# Patient Record
Sex: Female | Born: 1990
Health system: Southern US, Community
[De-identification: ages and names within clinical notes are randomized; demographics above are authoritative.]

## PROBLEM LIST (undated history)

## (undated) DIAGNOSIS — Z8659 Personal history of other mental and behavioral disorders: Secondary | ICD-10-CM

## (undated) DIAGNOSIS — R000 Tachycardia, unspecified: Secondary | ICD-10-CM

## (undated) DIAGNOSIS — F988 Other specified behavioral and emotional disorders with onset usually occurring in childhood and adolescence: Secondary | ICD-10-CM

## (undated) HISTORY — DX: Tachycardia, unspecified: R00.0

## (undated) HISTORY — DX: Personal history of other mental and behavioral disorders: Z86.59

## (undated) HISTORY — DX: Other specified behavioral and emotional disorders with onset usually occurring in childhood and adolescence: F98.8

---

## 2016-06-02 ENCOUNTER — Ambulatory Visit (INDEPENDENT_AMBULATORY_CARE_PROVIDER_SITE_OTHER): Payer: 59 | Admitting: Osteopathic Medicine

## 2016-06-02 VITALS — BP 130/88 | HR 97 | Wt 158.0 lb

## 2016-06-02 DIAGNOSIS — Z3041 Encounter for surveillance of contraceptive pills: Secondary | ICD-10-CM | POA: Diagnosis not present

## 2016-06-02 DIAGNOSIS — Z8659 Personal history of other mental and behavioral disorders: Secondary | ICD-10-CM | POA: Diagnosis not present

## 2016-06-02 DIAGNOSIS — Z113 Encounter for screening for infections with a predominantly sexual mode of transmission: Secondary | ICD-10-CM

## 2016-06-02 DIAGNOSIS — F988 Other specified behavioral and emotional disorders with onset usually occurring in childhood and adolescence: Secondary | ICD-10-CM | POA: Insufficient documentation

## 2016-06-02 DIAGNOSIS — N63 Unspecified lump in unspecified breast: Secondary | ICD-10-CM | POA: Insufficient documentation

## 2016-06-02 HISTORY — DX: Other specified behavioral and emotional disorders with onset usually occurring in childhood and adolescence: F98.8

## 2016-06-02 MED ORDER — LAMOTRIGINE 25 MG PO TABS: 25.0000 mg | ORAL_TABLET | Freq: Every day | ORAL | 3 refills | Status: DC

## 2016-06-02 MED ORDER — NORGESTIMATE-ETH ESTRADIOL 0.25-35 MG-MCG PO TABS: 1.0000 | ORAL_TABLET | Freq: Every day | ORAL | 4 refills | Status: DC

## 2016-06-02 NOTE — Progress Notes (Signed)
HPI: Jenny Navarro is a 25 y.o. female  who presents to [facility] [place] today, 06/02/16,  for chief complaint of:  Chief Complaint  Patient presents with  . Establish Care    R breast lump bothering her  No medications in the past month - list includes Sprintec, Lamictal 25 mg bid, Vyvanse 75 mg daily + Adderall 10 mg daily  ADHD: Going to school for nursing. Previously on Strattera,. Had been on Vyvanse x2 years and Adderall added a bit after then. Awaiting records.   Previous treatment with Cymbalta caused panic attacks, Celexa, Risperdal, another mood stabilizer can't remember. Lamictal had been helping.     Past medical, surgical, social and family history reviewed: No past medical history on file. No past surgical history on file. Social History  Substance Use Topics  . Smoking status: Not on file  . Smokeless tobacco: Not on file  . Alcohol use Not on file   No family history on file.   Current medication list and allergy/intolerance information reviewed:   No current outpatient prescriptions on file.   No current facility-administered medications for this visit.    Allergies  Allergen Reactions  . Penicillins       Review of Systems:  Constitutional:  No  fever, no chills, No recent illness, No unintentional weight changes. No significant fatigue.   HEENT: No  headache, no vision change, no hearing change, No sore throat, No  sinus pressure  Cardiac: No  chest pain, No  pressure, No palpitations, No  Orthopnea  Respiratory:  No  shortness of breath. No  Cough  Gastrointestinal: No  abdominal pain, No  nausea, No  vomiting,  No  blood in stool, No  diarrhea, No  constipation   Musculoskeletal: No new myalgia/arthralgia  Genitourinary: No  incontinence, No  abnormal genital bleeding, No abnormal genital discharge  Skin: No  Rash, No other wounds/concerning lesions  Hem/Onc: No  easy bruising/bleeding, No  abnormal lymph  node  Endocrine: No cold intolerance,  No heat intolerance. No polyuria/polydipsia/polyphagia   Neurologic: No  weakness, No  dizziness, No  slurred speech/focal weakness/facial droop  Psychiatric: No  concerns with depression, No  concerns with anxiety, No sleep problems, No mood problems  Exam:  BP 130/88   Pulse 97   Wt 158 lb (71.7 kg)   Constitutional: VS see above. General Appearance: alert, well-developed, well-nourished, NAD  Eyes: Normal lids and conjunctive, non-icteric sclera  Ears, Nose, Mouth, Throat: MMM, Normal external inspection ears/nares/mouth/lips/gums.   Neck: No masses, trachea midline. No thyroid enlargement. No tenderness/mass appreciated. No lymphadenopathy  Respiratory: Normal respiratory effort. no wheeze, no rhonchi, no rales  Cardiovascular: S1/S2 normal, no murmur, no rub/gallop auscultated. RRR.   Musculoskeletal: Gait normal. No clubbing/cyanosis of digits.   Neurological:  Normal balance/coordination. No tremor.   Skin: warm, dry, intact. No rash/ulcer. No concerning nevi or subq nodules on limited exam.    Psychiatric: Normal judgment/insight. Normal mood and affect. Oriented x3.   BREAST: No rashes/skin changes, normal fibrous breast tissue though a bit more dense at next position just adjacent to right nipple, no masses or tenderness, normal nipple without discharge, normal axilla     ASSESSMENT/PLAN:   History of bipolar disorder - Patient reports has been well controlled on Lamictal, refilled this medication 06/02/2016 - Plan: lamoTRIgine (LAMICTAL) 25 MG tablet, COMPLETE METABOLIC PANEL WITH GFR, CBC with Differential/Platelet, TSH, Pregnancy, urine  Oral contraceptive pill surveillance - Plan: norgestimate-ethinyl estradiol (ORTHO-CYCLEN,SPRINTEC,PREVIFEM)  0.25-35 MG-MCG tablet, COMPLETE METABOLIC PANEL WITH GFR, CBC with Differential/Platelet, Pregnancy, urine  Routine screening for STI (sexually transmitted infection) - Plan: HIV  antibody, RPR, Hepatitis B surface antigen, Hepatitis B core antibody, total, GC/chlamydia probe amp, urine  Adult attention deficit disorder - Awaiting records as of 06/02/2016 - Plan: Drug Screen, Urine  Breast lump in female - Offered ultrasound though I suspect she has prominent lobar fibrocystic change, I don't feel any cyst or mass   Patient Instructions  Will review records from previous behavioral health with regard to care for bipolar disorder and attention deficit disorder. If no concerns on records or the urine drug screen, I'm okay to refill the Vyvanse plus the Adderall with plans to follow-up in 3 months in the office with me.  Prescriptions have been sent for birth control and Lamictal, let me know if any issues getting these medications. Please wait till you hear from [place] for confirmed negative pregnancy before starting these medications. Birth control pills can be started within 5 days of the first day of your period without needing to use a backup method. Ask your pharmacist if there are any other questions about restarting this medicine.   If breast lump changes or worsens in any way, please let me know! We have the option for further evaluation with an ultrasound if there is any concern.    Visit summary with medication list and pertinent instructions was printed for patient to review. All questions at time of visit were answered - patient instructed to contact office with any additional concerns. ER/RTC precautions were reviewed with the patient. Follow-up plan: Return in about 3 months (around 09/02/2016) for MEDICATION MANAGEMENT - ADD AND MOOD, sooner if needed.

## 2016-06-02 NOTE — Patient Instructions (Signed)
Will review records from previous behavioral health with regard to care for bipolar disorder and attention deficit disorder. If no concerns, but on the records or the urine drug screen, I'm okay to refill the Vyvanse plus the Adderall with plans to follow-up in 3 months in the office with me.  Prescriptions have been sent for birth control and Lamictal, let me know if any issues getting these medications. Please wait till use here from us for confirmed negative pregnancy before starting these medications. Birth control pills can be started within 5 days of the first day of your period without needing to use a backup method. Ask your pharmacist if there are any other questions about restarting this medicine.   If breast lump changes or worsens in any way, please let me know! We have the option for further evaluation with an ultrasound if there is any concern.

## 2016-06-03 LAB — CBC WITH DIFFERENTIAL/PLATELET
BASOS PCT: 1 %
Basophils Absolute: 69 cells/uL (ref 0–200)
EOS ABS: 345 {cells}/uL (ref 15–500)
Eosinophils Relative: 5 %
HEMATOCRIT: 41.7 % (ref 35.0–45.0)
HEMOGLOBIN: 13.7 g/dL (ref 11.7–15.5)
LYMPHS ABS: 2898 {cells}/uL (ref 850–3900)
Lymphocytes Relative: 42 %
MCH: 30.9 pg (ref 27.0–33.0)
MCHC: 32.9 g/dL (ref 32.0–36.0)
MCV: 94.1 fL (ref 80.0–100.0)
MONO ABS: 690 {cells}/uL (ref 200–950)
MPV: 10.2 fL (ref 7.5–12.5)
Monocytes Relative: 10 %
NEUTROS PCT: 42 %
Neutro Abs: 2898 cells/uL (ref 1500–7800)
Platelets: 458 10*3/uL — ABNORMAL HIGH (ref 140–400)
RBC: 4.43 MIL/uL (ref 3.80–5.10)
RDW: 12.6 % (ref 11.0–15.0)
WBC: 6.9 10*3/uL (ref 3.8–10.8)

## 2016-06-03 LAB — HIV ANTIBODY (ROUTINE TESTING W REFLEX): HIV: NONREACTIVE

## 2016-06-03 LAB — COMPLETE METABOLIC PANEL WITH GFR
ALT: 21 U/L (ref 6–29)
AST: 25 U/L (ref 10–30)
Albumin: 4.1 g/dL (ref 3.6–5.1)
Alkaline Phosphatase: 74 U/L (ref 33–115)
BUN: 6 mg/dL — AB (ref 7–25)
CALCIUM: 9.7 mg/dL (ref 8.6–10.2)
CHLORIDE: 104 mmol/L (ref 98–110)
CO2: 24 mmol/L (ref 20–31)
Creat: 0.66 mg/dL (ref 0.50–1.10)
GFR, Est African American: >89 mL/min (ref >=60)
GFR, Est Non African American: >89 mL/min (ref >=60)
Glucose, Bld: 114 mg/dL — ABNORMAL HIGH (ref 65–99)
POTASSIUM: 4.2 mmol/L (ref 3.5–5.3)
SODIUM: 137 mmol/L (ref 135–146)
Total Bilirubin: 0.2 mg/dL (ref 0.2–1.2)
Total Protein: 7 g/dL (ref 6.1–8.1)

## 2016-06-03 LAB — GC/CHLAMYDIA PROBE AMP
CT PROBE, AMP APTIMA: NOT DETECTED
GC PROBE AMP APTIMA: NOT DETECTED

## 2016-06-03 LAB — PREGNANCY, URINE: Preg Test, Ur: NEGATIVE

## 2016-06-03 LAB — HEPATITIS B SURFACE ANTIGEN: Hepatitis B Surface Ag: NEGATIVE

## 2016-06-03 LAB — TSH: TSH: 1.33 mIU/L

## 2016-06-03 LAB — RPR

## 2016-06-03 LAB — HEPATITIS B CORE ANTIBODY, TOTAL: HEP B C TOTAL AB: NONREACTIVE

## 2016-06-04 LAB — PAIN MGMT, PROFILE 6 CONF W/O MM, U
6 Acetylmorphine: NEGATIVE ng/mL (ref <10)
Alcohol Metabolites: NEGATIVE ng/mL (ref <500)
Amphetamines: NEGATIVE ng/mL (ref <500)
Barbiturates: NEGATIVE ng/mL (ref <300)
Benzodiazepines: NEGATIVE ng/mL (ref <100)
Cocaine Metabolite: NEGATIVE ng/mL (ref <150)
Creatinine: 148.2 mg/dL (ref >=20.0)
Marijuana Metabolite: NEGATIVE ng/mL (ref <20)
Methadone Metabolite: NEGATIVE ng/mL (ref <100)
OPIATES: NEGATIVE ng/mL (ref <100)
Oxidant: NEGATIVE ug/mL (ref <200)
Oxycodone: NEGATIVE ng/mL (ref <100)
PLEASE NOTE: 0
Phencyclidine: NEGATIVE ng/mL (ref <25)
pH: 6.81 (ref 4.5–9.0)

## 2016-06-11 ENCOUNTER — Telehealth: Payer: Self-pay | Admitting: Osteopathic Medicine

## 2016-06-11 DIAGNOSIS — F988 Other specified behavioral and emotional disorders with onset usually occurring in childhood and adolescence: Secondary | ICD-10-CM

## 2016-06-11 MED ORDER — AMPHETAMINE-DEXTROAMPHETAMINE 10 MG PO TABS: 10.0000 mg | ORAL_TABLET | Freq: Every day | ORAL | 0 refills | Status: DC

## 2016-06-11 MED ORDER — LISDEXAMFETAMINE DIMESYLATE 70 MG PO CAPS: 70.0000 mg | ORAL_CAPSULE | ORAL | 0 refills | Status: DC

## 2016-06-11 NOTE — Telephone Encounter (Signed)
Please call patient: I have received previous records for bipolar and ADHD. OK to right for the Vyvanse 70 mg and Adderall 10 mg, Rx printed to pick up at front

## 2016-06-12 MED FILL — DEXTROAMP-AMP 10 MG TAB: 10 | 30 days supply | Qty: 30 | Fill #0

## 2016-06-12 MED FILL — MONO-LINYAH 28 TABLET: 0.25-35 | 84 days supply | Qty: 84 | Fill #0

## 2016-06-12 MED FILL — VYVANSE 70 MG CAPSULE: 70 | 30 days supply | Qty: 30 | Fill #0

## 2016-06-12 MED FILL — lamoTRIgine 25 MG TABS: 25 | 60 days supply | Qty: 60 | Fill #0

## 2016-06-12 NOTE — Telephone Encounter (Signed)
Left message on patient vm advising that Rx for Adderall and Vyvanse is ready for pickup. Jeevan Kalla,CMA

## 2016-07-13 MED FILL — DEXTROAMP-AMP 10 MG TAB: 10 | 30 days supply | Qty: 30 | Fill #0

## 2016-07-13 MED FILL — VYVANSE 70 MG CAPSULE: 70 | 30 days supply | Qty: 30 | Fill #0

## 2016-08-12 MED FILL — lamoTRIgine 25 MG TABS: 25 | 60 days supply | Qty: 60 | Fill #1

## 2016-08-12 MED FILL — DEXTROAMP-AMP 10 MG TAB: 10 | 30 days supply | Qty: 30 | Fill #0

## 2016-08-12 MED FILL — VYVANSE 70 MG CAPSULE: 70 | 30 days supply | Qty: 30 | Fill #0

## 2016-09-02 ENCOUNTER — Ambulatory Visit: Payer: 59 | Admitting: Osteopathic Medicine

## 2016-09-07 ENCOUNTER — Encounter: Payer: Self-pay | Admitting: Osteopathic Medicine

## 2016-09-07 ENCOUNTER — Ambulatory Visit (INDEPENDENT_AMBULATORY_CARE_PROVIDER_SITE_OTHER): Payer: 59 | Admitting: Osteopathic Medicine

## 2016-09-07 VITALS — BP 138/92 | HR 86 | Ht 66.0 in | Wt 150.0 lb

## 2016-09-07 DIAGNOSIS — N926 Irregular menstruation, unspecified: Secondary | ICD-10-CM | POA: Diagnosis not present

## 2016-09-07 DIAGNOSIS — F988 Other specified behavioral and emotional disorders with onset usually occurring in childhood and adolescence: Secondary | ICD-10-CM | POA: Diagnosis not present

## 2016-09-07 DIAGNOSIS — Z30011 Encounter for initial prescription of contraceptive pills: Secondary | ICD-10-CM

## 2016-09-07 DIAGNOSIS — Z3009 Encounter for other general counseling and advice on contraception: Secondary | ICD-10-CM

## 2016-09-07 DIAGNOSIS — Z8659 Personal history of other mental and behavioral disorders: Secondary | ICD-10-CM | POA: Diagnosis not present

## 2016-09-07 LAB — POCT URINE PREGNANCY: PREG TEST UR: NEGATIVE

## 2016-09-07 MED ORDER — AMPHETAMINE-DEXTROAMPHETAMINE 10 MG PO TABS: 10.0000 mg | ORAL_TABLET | Freq: Every day | ORAL | 0 refills | Status: DC

## 2016-09-07 MED ORDER — MEDROXYPROGESTERONE ACETATE 150 MG/ML IM SUSP
150.0000 mg | Freq: Once | INTRAMUSCULAR | Status: AC
  Administered 2016-09-07: 150 mg via INTRAMUSCULAR

## 2016-09-07 MED ORDER — LISDEXAMFETAMINE DIMESYLATE 70 MG PO CAPS: 70.0000 mg | ORAL_CAPSULE | ORAL | 0 refills | Status: DC

## 2016-09-07 MED ORDER — LAMOTRIGINE 25 MG PO TABS: 25.0000 mg | ORAL_TABLET | Freq: Two times a day (BID) | ORAL | 5 refills | Status: DC

## 2016-09-07 NOTE — Progress Notes (Signed)
HPI: Jenny Navarro is a 26 y.o. female  who presents to [facility] [place] today, 09/07/16,  for chief complaint of:  Chief Complaint  Patient presents with  . Follow-up    ADD    Doing well on current ADHD medications, would like to continue  Bipolar: incorrect sig for Lamictal on file, this was corrected and prescription renewed. She notes that depression has been more of a problem since her dose decreased  Contraception: asks about alternative to her current birth control, is having some irregular bleeding and would like to get on the Depo shot.    Past medical, surgical, social and family history reviewed: Patient Active Problem List   Diagnosis Date Noted  . Breast lump in female 06/02/2016  . Adult attention deficit disorder 06/02/2016   No past surgical history on file. Social History  Substance Use Topics  . Smoking status: Never Smoker  . Smokeless tobacco: Never Used  . Alcohol use Not on file   No family history on file.   Current medication list and allergy/intolerance information reviewed:   Current Outpatient Prescriptions on File Prior to Visit  Medication Sig Dispense Refill  . amphetamine-dextroamphetamine (ADDERALL) 10 MG tablet Take 1 tablet (10 mg total) by mouth daily after lunch. 30 tablet 0  . lamoTRIgine (LAMICTAL) 25 MG tablet Take 1 tablet (25 mg total) by mouth daily. 60 tablet 3  . lisdexamfetamine (VYVANSE) 70 MG capsule Take 1 capsule (70 mg total) by mouth every morning. 30 capsule 0  . norgestimate-ethinyl estradiol (ORTHO-CYCLEN,SPRINTEC,PREVIFEM) 0.25-35 MG-MCG tablet Take 1 tablet by mouth daily. Use as directed. 3 Package 4   No current facility-administered medications on file prior to visit.    Allergies  Allergen Reactions  . Penicillins       Review of Systems:  Constitutional: feels well today  Cardiac: No  chest pain  Respiratory:  No  shortness of breath  Gastrointestinal: No  abdominal  pain  GU: irregular periods on birth control, cramping, no abnormal vaginal dscharge  Musculoskeletal: No new myalgia/arthralgia  Neurologic: No  weakness, No  Dizziness  Psychiatric: +concerns with depression, +concerns with anxiety  Exam:  BP (!) 138/92   Pulse 86   Ht 5\' 6"  (1.676 m)   Wt 150 lb (68 kg)   BMI 24.21 kg/m   Constitutional: VS see above. General Appearance: alert, well-developed, well-nourished, NAD  Eyes: Normal lids and conjunctive, non-icteric sclera  Ears, Nose, Mouth, Throat: MMM, Normal external inspection ears/nares/mouth/lips/gums.  Neck: No masses, trachea midline.   Respiratory: Normal respiratory effort. no wheeze, no rhonchi, no rales  Cardiovascular: S1/S2 normal, no murmur, no rub/gallop auscultated. RRR.   Musculoskeletal: Gait normal. Symmetric and independent movement of all extremities  Neurological: Normal balance/coordination. No tremor.  Skin: warm, dry, intact.   Psychiatric: Normal judgment/insight. Normal mood and affect. Oriented x3.    Results for orders placed or performed in visit on 09/07/16 (from the past 72 hour(s))  POCT urine pregnancy     Status: None   Collection Time: 09/07/16  3:18 PM  Result Value Ref Range   Preg Test, Ur Negative Negative      ASSESSMENT/PLAN:   Attention deficit disorder, unspecified hyperactivity presence - 3 mos medications Rx'ed, ok to refill x3 mos at nurse visit for Depo shot w/ f/u Dr. Mervyn Skeeters. in 6 mos - Plan: amphetamine-dextroamphetamine (ADDERALL) 10 MG tablet, lisdexamfetamine (VYVANSE) 70 MG capsule, DISCONTINUED: lisdexamfetamine (VYVANSE) 70 MG capsule, DISCONTINUED: amphetamine-dextroamphetamine (ADDERALL) 10 MG  tablet, DISCONTINUED: amphetamine-dextroamphetamine (ADDERALL) 10 MG tablet, DISCONTINUED: lisdexamfetamine (VYVANSE) 70 MG capsule  History of bipolar disorder - Plan: lamoTRIgine (LAMICTAL) 25 MG tablet  Menstrual periods irregular - likely OCP, pt opts for  Depo  Encounter for other general counseling or advice on contraception - Declines alternative OCP/ring, IUD, Nexplanon - Plan: POCT urine pregnancy, medroxyPROGESTERone (DEPO-PROVERA) injection 150 mg    Patient Instructions  Follow up in 12 weeks for next Depo shot - call ahead to be placed on the nurse visit schedule and also ask to pick up refills of your Attention Deficit medications - be sure to ask about these ahead of time so prescriptions will be printed and ready for you. Plan to follow-up for visit with Dr Lyn HollingsheadAlexander in 6 months around time due for third Depo shot. Any questions or concerns  In the meantime, let us know!     Visit summary with medication list and pertinent instructions was printed for patient to review. All questions at time of visit were answered - patient instructed to contact office with any additional concerns. ER/RTC precautions were reviewed with the patient. Follow-up plan: Return in about 12 weeks (around 11/30/2016) for depo shot (can pick up another 3 months ADHD medications at that nurse visit - call ahead of appt.

## 2016-09-07 NOTE — Patient Instructions (Signed)
Follow up in 12 weeks for next Depo shot - call ahead to be placed on the nurse visit schedule and also ask to pick up refills of your Attention Deficit medications - be sure to ask about these ahead of time so prescriptions will be printed and ready for you. Plan to follow-up for visit with Dr Lyn HollingsheadAlexander in 6 months around time due for third Depo shot. Any questions or concerns  In the meantime, let us know!

## 2016-09-08 DIAGNOSIS — Z8659 Personal history of other mental and behavioral disorders: Secondary | ICD-10-CM | POA: Insufficient documentation

## 2016-09-08 HISTORY — DX: Personal history of other mental and behavioral disorders: Z86.59

## 2016-09-08 MED FILL — DEXTROAMP-AMP 10 MG TAB: 10 | 30 days supply | Qty: 30 | Fill #0

## 2016-09-08 MED FILL — VYVANSE 70 MG CAPSULE: 70 | 30 days supply | Qty: 30 | Fill #0

## 2016-10-09 MED FILL — lamoTRIgine 25 MG TABS: 25 | 30 days supply | Qty: 60 | Fill #0 | Status: TO

## 2016-10-09 MED FILL — VYVANSE 70 MG CAPSULE: 70 | 30 days supply | Qty: 30 | Fill #0

## 2016-10-09 MED FILL — DEXTROAMP-AMPHETAMIN 10 MG: 10 | 30 days supply | Qty: 30 | Fill #0

## 2016-11-06 MED FILL — DEXTROAMP-AMPHETAMIN 10 MG: 10 | 30 days supply | Qty: 30 | Fill #0

## 2016-11-06 MED FILL — VYVANSE 70 MG CAPSULE: 70 | 30 days supply | Qty: 30 | Fill #0

## 2016-11-17 ENCOUNTER — Other Ambulatory Visit: Payer: Self-pay | Admitting: Osteopathic Medicine

## 2016-11-17 DIAGNOSIS — F988 Other specified behavioral and emotional disorders with onset usually occurring in childhood and adolescence: Secondary | ICD-10-CM

## 2016-11-23 MED FILL — lamoTRIgine 25 MG TABS: 25 | 30 days supply | Qty: 60 | Fill #0

## 2016-11-30 ENCOUNTER — Ambulatory Visit: Payer: 59

## 2016-12-01 ENCOUNTER — Ambulatory Visit: Payer: 59

## 2016-12-02 ENCOUNTER — Encounter: Payer: Self-pay | Admitting: Osteopathic Medicine

## 2016-12-02 ENCOUNTER — Ambulatory Visit (INDEPENDENT_AMBULATORY_CARE_PROVIDER_SITE_OTHER): Payer: 59 | Admitting: Osteopathic Medicine

## 2016-12-02 VITALS — BP 141/93 | HR 94 | Ht 66.0 in | Wt 147.0 lb

## 2016-12-02 DIAGNOSIS — Z308 Encounter for other contraceptive management: Secondary | ICD-10-CM | POA: Diagnosis not present

## 2016-12-02 DIAGNOSIS — F339 Major depressive disorder, recurrent, unspecified: Secondary | ICD-10-CM | POA: Diagnosis not present

## 2016-12-02 DIAGNOSIS — R030 Elevated blood-pressure reading, without diagnosis of hypertension: Secondary | ICD-10-CM

## 2016-12-02 DIAGNOSIS — F988 Other specified behavioral and emotional disorders with onset usually occurring in childhood and adolescence: Secondary | ICD-10-CM

## 2016-12-02 DIAGNOSIS — Z8659 Personal history of other mental and behavioral disorders: Secondary | ICD-10-CM | POA: Diagnosis not present

## 2016-12-02 MED ORDER — BUPROPION HCL ER (XL) 150 MG PO TB24: 150.0000 mg | ORAL_TABLET | Freq: Every day | ORAL | 1 refills | Status: DC

## 2016-12-02 MED ORDER — LISDEXAMFETAMINE DIMESYLATE 70 MG PO CAPS: 70.0000 mg | ORAL_CAPSULE | ORAL | 0 refills | Status: DC

## 2016-12-02 MED ORDER — LISDEXAMFETAMINE DIMESYLATE 60 MG PO CAPS: 60.0000 mg | ORAL_CAPSULE | ORAL | 0 refills | Status: DC

## 2016-12-02 MED ORDER — MEDROXYPROGESTERONE ACETATE 150 MG/ML IM SUSP
150.0000 mg | Freq: Once | INTRAMUSCULAR | Status: AC
  Administered 2016-12-02: 150 mg via INTRAMUSCULAR

## 2016-12-02 MED ORDER — AMPHETAMINE-DEXTROAMPHETAMINE 10 MG PO TABS: 10.0000 mg | ORAL_TABLET | Freq: Every day | ORAL | 0 refills | Status: DC

## 2016-12-02 MED FILL — BUPROPION HCL XL 150 MG TAB: 150 | 30 days supply | Qty: 30 | Fill #0

## 2016-12-02 NOTE — Progress Notes (Signed)
HPI: Jenny Navarro is a 26 y.o. female  who presents to [facility] [place] today, 12/02/16,  for chief complaint of:  Chief Complaint  Patient presents with  . 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 No chest pain/pressure/palpitations.   Bipolar: Depression has been a problem lately. Lamictal helps but not as much as it used to. Multiple SSRI and mood stabilizers used in the past which were either ineffective or caused side effects. See scanned docs for records which were reviewed and confirmed with the patient.   Contraception: doing well on Depo   Past medical, surgical, social and family history reviewed: Patient Active Problem List   Diagnosis Date Noted  . History of bipolar disorder 09/08/2016  . Breast lump in female 06/02/2016  . Adult attention deficit disorder 06/02/2016   No past surgical history on file. Social History  Substance Use Topics  . Smoking status: Never Smoker  . Smokeless tobacco: Never Used  . Alcohol use Not on file   No family history on file.   Current medication list and allergy/intolerance information reviewed:   Current Outpatient Prescriptions on File Prior to Visit  Medication Sig Dispense Refill  . amphetamine-dextroamphetamine (ADDERALL) 10 MG tablet Take 1 tablet (10 mg total) by mouth daily after lunch. 30 tablet 0  . lamoTRIgine (LAMICTAL) 25 MG tablet Take 1 tablet (25 mg total) by mouth 2 (two) times daily. 60 tablet 5  . lisdexamfetamine (VYVANSE) 70 MG capsule Take 1 capsule (70 mg total) by mouth every morning. 30 capsule 0  . norgestimate-ethinyl estradiol (ORTHO-CYCLEN,SPRINTEC,PREVIFEM) 0.25-35 MG-MCG tablet Take 1 tablet by mouth daily. Use as directed. 3 Package 4   No current facility-administered medications on file prior to visit.    Allergies  Allergen Reactions  . Penicillins        Review of Systems:  Constitutional: no recent illness  Cardiac: No  chest pain  Respiratory:  No  shortness of breath  Neurologic: No  weakness, No  Dizziness  Psychiatric: +concerns with depression, +concerns with anxiety  Exam:  BP (!) 140/100   Ht  (1.676 m)   Wt 147 lb (66.7 kg)   BMI 23.73 kg/m   Constitutional: VS see above. General Appearance: alert, well-developed, well-nourished, NAD  Eyes: Normal lids and conjunctive, non-icteric sclera  Ears, Nose, Mouth, Throat: MMM, Normal external inspection ears/nares/mouth/lips/gums.  Neck: No masses, trachea midline.   Respiratory: Normal respiratory effort. no wheeze, no rhonchi, no rales  Cardiovascular: S1/S2 normal, no murmur, no rub/gallop auscultated. RRR.   Musculoskeletal: Gait normal. Symmetric and independent movement of all extremities  Neurological: Normal balance/coordination. No tremor.  Skin: warm, dry, intact.   Psychiatric: Normal judgment/insight. Normal mood and affect. Oriented x3. No SI/HI, (+)passive death thoughts but no plan/attempt - recognizes as passing thoughts and is seeking help   Depression screen [facility] 2/9 12/02/2016  Decreased Interest 3  Down, Depressed, Hopeless 3  PHQ - 2 Score 6  Altered sleeping 2  Tired, decreased energy 3  Change in appetite 2  Feeling bad or failure about yourself  3  Trouble concentrating 2  Moving slowly or fidgety/restless 2  Suicidal thoughts 1  PHQ-9 Score 21   GAD 7 : Generalized Anxiety Score 12/02/2016  Nervous, Anxious, on Edge 3  Control/stop worrying 2  Worry too much - different things 2  Trouble relaxing 2  Restless 3  Easily annoyed or irritable 3  Afraid - awful might happen 3  Total GAD 7 Score 18       ASSESSMENT/PLAN: The primary encounter diagnosis was Attention deficit disorder, unspecified hyperactivity presence. Diagnoses of Encounter for other contraceptive management, History of bipolar disorder, Depression,  recurrent (HCC), and Elevated blood pressure reading were also pertinent to this visit.  Referrals: Psychiatry  Medication changes: Lower Vyvanse dose from 70 to 60 Start Wellbutrin 150  Patient Instructions  Plan: 1. Reduced dose Vyvanse 2. Continue current dose Adderall 3. Start Wellbutrin for depression / augment bipolar 4. Referral to psych - you should get a call 5. Send me a message if you need a referral to the counselor  6. Follow-up with me in 4 weeks, sooner if needed 7. Keep BP log at work   Pt to [employer] me w/ BP readings and name of psychologist she's like a referral to.   BP gola <130/80 reviewed w/ pt - if consistently higher over the next 2 weeks would lower the stimulant dose further   Would also consider progesterone-mediated depression, shot was already given before I saw her but we may need to discuss alternatives if depression doesn't improve with intervention of new meds and psych referral.     Visit summary with medication list and pertinent instructions was printed for patient to review. All questions at time of visit were answered - patient instructed to contact office with any additional concerns. ER/RTC precautions were reviewed with the patient. Follow-up plan: Return in about 4 weeks (around 12/30/2016) for medication and blood pressure recheck - unless able to get in with psych sooner. Marland Kitchen

## 2016-12-02 NOTE — Patient Instructions (Addendum)
Plan: 1. Reduced dose Vyvanse 2. Continue current dose Adderall 3. Start Wellbutrin for depression / augment bipolar 4. Referral to psych - you should get a call 5. Send me a message if you need a referral to the counselor  6. Follow-up with me in 4 weeks, sooner if needed 7. Keep BP log at work

## 2016-12-04 MED FILL — AMPHETAMINE SALTS 10 MG TAB: 10 | 30 days supply | Qty: 30 | Fill #0

## 2016-12-04 MED FILL — VYVANSE 60 MG CAPSULE: 60 | 30 days supply | Qty: 30 | Fill #0

## 2016-12-30 ENCOUNTER — Encounter: Payer: Self-pay | Admitting: Osteopathic Medicine

## 2016-12-30 ENCOUNTER — Ambulatory Visit (INDEPENDENT_AMBULATORY_CARE_PROVIDER_SITE_OTHER): Payer: 59 | Admitting: Osteopathic Medicine

## 2016-12-30 VITALS — BP 121/83 | HR 79 | Ht 66.0 in | Wt 147.0 lb

## 2016-12-30 DIAGNOSIS — Z8659 Personal history of other mental and behavioral disorders: Secondary | ICD-10-CM | POA: Diagnosis not present

## 2016-12-30 DIAGNOSIS — F988 Other specified behavioral and emotional disorders with onset usually occurring in childhood and adolescence: Secondary | ICD-10-CM

## 2016-12-30 DIAGNOSIS — F3342 Major depressive disorder, recurrent, in full remission: Secondary | ICD-10-CM

## 2016-12-30 MED ORDER — AMPHETAMINE-DEXTROAMPHETAMINE 10 MG PO TABS: 10.0000 mg | ORAL_TABLET | Freq: Every day | ORAL | 0 refills | Status: DC

## 2016-12-30 MED ORDER — MEDROXYPROGESTERONE ACETATE 150 MG/ML IM SUSP: 150.0000 mg | INTRAMUSCULAR | 0 refills | Status: DC

## 2016-12-30 MED ORDER — BUPROPION HCL ER (XL) 150 MG PO TB24: 150.0000 mg | ORAL_TABLET | Freq: Every day | ORAL | 1 refills | Status: DC

## 2016-12-30 MED ORDER — LISDEXAMFETAMINE DIMESYLATE 60 MG PO CAPS: 60.0000 mg | ORAL_CAPSULE | ORAL | 0 refills | Status: DC

## 2016-12-30 MED FILL — BUPROPION HCL XL 150 MG TAB: 150 | 90 days supply | Qty: 90 | Fill #0

## 2016-12-30 NOTE — Progress Notes (Signed)
HPI: Jenny Navarro is a 26 y.o. female  who presents to [facility] [place] today, 12/30/16,  for chief complaint of:  Chief Complaint  Patient presents with  . 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 No chest pain/pressure/palpitations. Work measurements have been better, systolic in the 120s, diastolic in 70s to 80s, a few occasions where 140s over 90s to 100s. Overall patient doing well on current medication regimen  Bipolar: Depression was worse at last visit, we added Wellbutrin in addition to her Lamictal and she reports much better mood, has been able to start exercising again, never heard about psychiatry referral but she is happy with current medications..   Contraception: doing well on Depo   Past medical, surgical, social and family history reviewed: Patient Active Problem List   Diagnosis Date Noted  . History of bipolar disorder 09/08/2016  . Breast lump in female 06/02/2016  . Adult attention deficit disorder 06/02/2016   No past surgical history on file. Social History  Substance Use Topics  . Smoking status: Never Smoker  . Smokeless tobacco: Never Used  . Alcohol use Not on file   No family history on file.   Current medication list and allergy/intolerance information reviewed:   Current Outpatient Prescriptions on File Prior to Visit  Medication Sig Dispense Refill  . amphetamine-dextroamphetamine (ADDERALL) 10 MG tablet Take 1 tablet (10 mg total) by mouth daily after lunch. 30 tablet 0  . buPROPion (WELLBUTRIN XL) 150 MG 24 hr tablet Take 1 tablet (150 mg total) by mouth daily. 30 tablet 1  . lamoTRIgine (LAMICTAL) 25 MG tablet Take 1 tablet (25 mg total) by mouth 2 (two) times daily. 60 tablet 5  . lisdexamfetamine (VYVANSE) 60 MG capsule Take 1 capsule (60 mg total) by mouth every morning. 30 capsule 0   No current  facility-administered medications on file prior to visit.    Allergies  Allergen Reactions  . Penicillins       Review of Systems:  Constitutional: no recent illness  Cardiac: No  chest pain  Respiratory:  No  shortness of breath  Neurologic: No  weakness, No  Dizziness  Psychiatric: +concerns with depression, +concerns with anxiety  Exam:  BP 121/83   Pulse 79   Ht 5\' 6"  (1.676 m)   Wt 147 lb (66.7 kg)   BMI 23.73 kg/m   Constitutional: VS see above. General Appearance: alert, well-developed, well-nourished, NAD  Psychiatric: Normal judgment/insight. Normal mood and affect. Oriented x3. No SI/HI,      ASSESSMENT/PLAN: The primary encounter diagnosis was Attention deficit disorder, unspecified hyperactivity presence. Diagnoses of Recurrent major depressive disorder, in full remission (HCC) and History of bipolar disorder were also pertinent to this visit.  Continue current medications. Three-month supply of ADHD medications provided to the patient. She is advised that if she would be concerned about another dosage switch, she would need to bring back the other prescriptions to turn in to me before we will switch the medication. Otherwise, doing well. Follow-up in 3 months.   Visit summary with medication list and pertinent instructions was printed for patient to review. All questions at time of visit were answered - patient instructed to contact office with any additional concerns. ER/RTC precautions were reviewed with the patient. Follow-up plan: Return in about 3 months (around 04/01/2017) for ADHD FOLLOW-UP.

## 2017-01-01 MED FILL — DEXTROAMP-AMP 10 MG TAB: 10 | 30 days supply | Qty: 30 | Fill #0

## 2017-01-01 MED FILL — VYVANSE 60 MG CAPSULE: 60 | 30 days supply | Qty: 30 | Fill #0

## 2017-01-29 MED FILL — VYVANSE 60 MG CAPSULE: 60 | 30 days supply | Qty: 30 | Fill #0

## 2017-01-29 MED FILL — AMPHETAMINE SALTS 10 MG TAB: 10 | 30 days supply | Qty: 30 | Fill #0

## 2017-01-29 MED FILL — lamoTRIgine 25 MG TABS: 25 | 30 days supply | Qty: 60 | Fill #1

## 2017-02-12 ENCOUNTER — Ambulatory Visit: Payer: Self-pay

## 2017-02-12 ENCOUNTER — Other Ambulatory Visit: Payer: Self-pay | Admitting: Occupational Medicine

## 2017-02-12 DIAGNOSIS — M25531 Pain in right wrist: Secondary | ICD-10-CM

## 2017-02-23 ENCOUNTER — Encounter: Payer: Self-pay | Admitting: Osteopathic Medicine

## 2017-02-23 ENCOUNTER — Ambulatory Visit: Payer: 59 | Admitting: Osteopathic Medicine

## 2017-02-23 ENCOUNTER — Ambulatory Visit (INDEPENDENT_AMBULATORY_CARE_PROVIDER_SITE_OTHER): Payer: 59 | Admitting: Osteopathic Medicine

## 2017-02-23 VITALS — BP 126/85 | HR 76 | Wt 149.0 lb

## 2017-02-23 DIAGNOSIS — Z3042 Encounter for surveillance of injectable contraceptive: Secondary | ICD-10-CM

## 2017-02-23 MED ORDER — MEDROXYPROGESTERONE ACETATE 150 MG/ML IM SUSP
150.0000 mg | Freq: Once | INTRAMUSCULAR | Status: AC
  Administered 2017-02-23: 150 mg via INTRAMUSCULAR

## 2017-02-23 NOTE — Progress Notes (Signed)
Pt here for Depo injection. Pt doing well with current regimen. Injection given in RUOQ. Pt advised to RTC between 05/11/17-05/24/17 for next injection.Laureen Ochs.Jamillia Closson, Viann Shoveonya Lynetta

## 2017-03-01 MED FILL — VYVANSE 60 MG CAPSULE: 60 | 30 days supply | Qty: 30 | Fill #0

## 2017-03-01 MED FILL — lamoTRIgine 25 MG TABS: 25 | 30 days supply | Qty: 60 | Fill #2

## 2017-03-01 MED FILL — DEXTROAMP-AMP 10 MG TAB: 10 | 30 days supply | Qty: 30 | Fill #0

## 2017-03-29 ENCOUNTER — Ambulatory Visit: Payer: Self-pay | Admitting: Osteopathic Medicine

## 2017-03-29 ENCOUNTER — Other Ambulatory Visit: Payer: Self-pay | Admitting: Osteopathic Medicine

## 2017-03-29 DIAGNOSIS — F3342 Major depressive disorder, recurrent, in full remission: Secondary | ICD-10-CM

## 2017-03-29 DIAGNOSIS — F988 Other specified behavioral and emotional disorders with onset usually occurring in childhood and adolescence: Secondary | ICD-10-CM

## 2017-03-29 NOTE — Telephone Encounter (Signed)
Pt was originally scheduled for an appointment today with Lyn HollingsheadAlexander but we rescheduled to next Tues. Aug. 21st. Pt would like to know if we can go ahead and refill her Vyvanse, Adderall, and Wellbutrin. Thanks

## 2017-03-30 MED ORDER — LISDEXAMFETAMINE DIMESYLATE 60 MG PO CAPS: 60.0000 mg | ORAL_CAPSULE | ORAL | 0 refills | Status: DC

## 2017-03-30 MED ORDER — AMPHETAMINE-DEXTROAMPHETAMINE 10 MG PO TABS: 10.0000 mg | ORAL_TABLET | Freq: Every day | ORAL | 0 refills | Status: DC

## 2017-03-30 NOTE — Telephone Encounter (Signed)
Okay to fill 30 day supply I will sign Rx once ordered

## 2017-03-30 NOTE — Telephone Encounter (Signed)
Not due to refill on Wellbutrin. Will route other two request to Provider in office that is covering PCP today.

## 2017-03-30 NOTE — Telephone Encounter (Signed)
Rx's printed. Left VM advising Pt.

## 2017-03-31 MED FILL — VYVANSE 60 MG CAPSULE: 60 | 30 days supply | Qty: 30 | Fill #0

## 2017-03-31 MED FILL — BUPROPION HCL XL 150 MG TAB: 150 | 90 days supply | Qty: 90 | Fill #1

## 2017-03-31 MED FILL — lamoTRIgine 25 MG TABS: 25 | 30 days supply | Qty: 60 | Fill #3

## 2017-03-31 MED FILL — AMPHETAMINE SALTS 10 MG TAB: 10 | 30 days supply | Qty: 30 | Fill #0

## 2017-04-06 ENCOUNTER — Ambulatory Visit: Payer: Self-pay | Admitting: Osteopathic Medicine

## 2017-04-06 DIAGNOSIS — Z0189 Encounter for other specified special examinations: Secondary | ICD-10-CM

## 2017-04-09 ENCOUNTER — Encounter: Payer: Self-pay | Admitting: Osteopathic Medicine

## 2017-04-09 ENCOUNTER — Ambulatory Visit (INDEPENDENT_AMBULATORY_CARE_PROVIDER_SITE_OTHER): Payer: 59 | Admitting: Osteopathic Medicine

## 2017-04-09 DIAGNOSIS — F988 Other specified behavioral and emotional disorders with onset usually occurring in childhood and adolescence: Secondary | ICD-10-CM | POA: Diagnosis not present

## 2017-04-09 DIAGNOSIS — Z8659 Personal history of other mental and behavioral disorders: Secondary | ICD-10-CM | POA: Diagnosis not present

## 2017-04-09 MED ORDER — LAMOTRIGINE 25 MG PO TABS: 25.0000 mg | ORAL_TABLET | Freq: Two times a day (BID) | ORAL | 3 refills | Status: DC

## 2017-04-09 MED ORDER — LISDEXAMFETAMINE DIMESYLATE 60 MG PO CAPS: 60.0000 mg | ORAL_CAPSULE | ORAL | 0 refills | Status: DC

## 2017-04-09 MED ORDER — AMPHETAMINE-DEXTROAMPHETAMINE 10 MG PO TABS: 10.0000 mg | ORAL_TABLET | Freq: Every day | ORAL | 0 refills | Status: DC

## 2017-04-09 NOTE — Progress Notes (Signed)
HPI: Jenny Navarro is a 26 y.o. female  who presents to [facility] today, 04/09/17,  for chief complaint of:  Chief Complaint  Patient presents with  . Follow-up    ADHD    Current ADHD medication treatment: we previously decreased the dose of Vyvanse d/t concern for slightly high BP - measures at work Have been normal. No chest pain/pressure/palpitations. Work measurements have been systolic in the 120s, diastolic in 70s to 80s, a few occasions where 140s over 90s to 100s. Overall patient doing well on current medication regimen. Last filled 03/31/17 per [facility] - Rx refilled by colleague while I was out of town.   Bipolar: Depression was worse at previous visit, we added Wellbutrin in addition to her Lamictal and she reports much better mood, has been able to start exercising again, never heard about psychiatry referral but she is happy with current medications.. Occasionally feels like Wellbutrin might make her little bit sleepy.  Contraception: doing well on Depo q3 mos   Past medical, surgical, social and family history reviewed: Patient Active Problem List   Diagnosis Date Noted  . History of bipolar disorder 09/08/2016  . Breast lump in female 06/02/2016  . Adult attention deficit disorder 06/02/2016   No past surgical history on file. Social History  Substance Use Topics  . Smoking status: Never Smoker  . Smokeless tobacco: Never Used  . Alcohol use Not on file   No family history on file.   Current medication list and allergy/intolerance information reviewed:   Current Outpatient Prescriptions on File Prior to Visit  Medication Sig Dispense Refill  . amphetamine-dextroamphetamine (ADDERALL) 10 MG tablet Take 1 tablet (10 mg total) by mouth daily after lunch. 30 tablet 0  . buPROPion (WELLBUTRIN XL) 150 MG 24 hr tablet Take 1 tablet (150 mg total) by mouth daily. 90 tablet 1  . lamoTRIgine (LAMICTAL) 25 MG tablet Take 1 tablet (25 mg  total) by mouth 2 (two) times daily. 60 tablet 5  . lisdexamfetamine (VYVANSE) 60 MG capsule Take 1 capsule (60 mg total) by mouth every morning. 30 capsule 0  . medroxyPROGESTERone (DEPO-PROVERA) 150 MG/ML injection Inject 1 mL (150 mg total) into the muscle every 3 (three) months. 1 mL 0   No current facility-administered medications on file prior to visit.    Allergies  Allergen Reactions  . Penicillins       Review of Systems:  Constitutional: no recent illness  Cardiac: No  chest pain  Respiratory:  No  shortness of breath  Neurologic: No  weakness, No  Dizziness  Psychiatric: +concerns with depression, +concerns with anxiety  Exam:  BP (!) 131/99   Pulse 95   Ht 5\' 6"  (1.676 m)   Wt 147 lb (66.7 kg)   BMI 23.73 kg/m   Constitutional: VS see above. General Appearance: alert, well-developed, well-nourished, NAD  CV: Regular rate and rhythm on auscultation, S1-S2 normal, no murmur  Respiratory: CTA BL  Psychiatric: Normal judgment/insight. Normal mood and affect. Oriented x3.      ASSESSMENT/PLAN: Diagnoses of Attention deficit disorder, unspecified hyperactivity presence and History of bipolar disorder were pertinent to this visit.  Continue current medications. Three-month supply of ADHD medications provided to the patient. She is advised that if she would be concerned about another dosage switch, she would need to bring back the other prescriptions to turn in to me before we will switch the medication. Otherwise, doing well. Advised can try switching the timing of  the Wellbutrin, we could consider discontinuing the Wellbutrin and increase in Lamictal, would recommend office visit for further discussion if she would like to try this. For now, overall she is happy with current medication regimen and would like to continue   Visit summary with medication list and pertinent instructions was printed for patient to review. All questions at time of visit were answered -  patient instructed to contact office with any additional concerns. ER/RTC precautions were reviewed with the patient. Follow-up plan: Return in about 4 months (around 07/29/2017) for ADHD REFILLS, sooner if needed and for Depo shots .

## 2017-04-30 MED FILL — VYVANSE 60 MG CAPSULE: 60 | 30 days supply | Qty: 30 | Fill #0

## 2017-04-30 MED FILL — AMPHETAMINE SALTS 10 MG TAB: 10 | 30 days supply | Qty: 30 | Fill #0

## 2017-04-30 MED FILL — lamoTRIgine 25 MG TABS: 25 | 90 days supply | Qty: 180 | Fill #0

## 2017-05-27 ENCOUNTER — Ambulatory Visit: Payer: Self-pay

## 2017-05-28 ENCOUNTER — Ambulatory Visit (INDEPENDENT_AMBULATORY_CARE_PROVIDER_SITE_OTHER): Payer: 59 | Admitting: Physician Assistant

## 2017-05-28 VITALS — BP 128/82 | HR 80

## 2017-05-28 DIAGNOSIS — Z3042 Encounter for surveillance of injectable contraceptive: Secondary | ICD-10-CM | POA: Insufficient documentation

## 2017-05-28 LAB — POCT URINE PREGNANCY: PREG TEST UR: NEGATIVE

## 2017-05-28 NOTE — Progress Notes (Signed)
Pt came into clinic today for Depo shot. She is out of range for injection window. Pt advised we would do a pregnancy test today, then she can come next week for second pregnancy test. If both negative, she can receive injection next week. Pt verbalized understanding. UPT negative, appt made for next week.

## 2017-05-31 ENCOUNTER — Ambulatory Visit (INDEPENDENT_AMBULATORY_CARE_PROVIDER_SITE_OTHER): Payer: 59 | Admitting: Osteopathic Medicine

## 2017-05-31 VITALS — BP 138/76 | HR 74

## 2017-05-31 DIAGNOSIS — Z3042 Encounter for surveillance of injectable contraceptive: Secondary | ICD-10-CM

## 2017-05-31 LAB — POCT URINE PREGNANCY: Preg Test, Ur: NEGATIVE

## 2017-05-31 MED ORDER — MEDROXYPROGESTERONE ACETATE 150 MG/ML IM SUSP
150.0000 mg | Freq: Once | INTRAMUSCULAR | Status: AC
  Administered 2017-05-31: 150 mg via INTRAMUSCULAR

## 2017-05-31 MED FILL — DEXTROAMP-AMP 10 MG TAB: 10 | 30 days supply | Qty: 30 | Fill #0

## 2017-05-31 MED FILL — VYVANSE 60 MG CAPSULE: 60 | 30 days supply | Qty: 30 | Fill #0

## 2017-05-31 NOTE — Progress Notes (Signed)
Pt in this morning for 2nd UPT and Depo shot.  UPT negative so injection was given.  Pt to return between Dec 31-Jan 14 for next Depo injection.

## 2017-06-29 ENCOUNTER — Ambulatory Visit (INDEPENDENT_AMBULATORY_CARE_PROVIDER_SITE_OTHER): Payer: 59 | Admitting: Osteopathic Medicine

## 2017-06-29 ENCOUNTER — Other Ambulatory Visit (HOSPITAL_COMMUNITY)
Admission: RE | Admit: 2017-06-29 | Discharge: 2017-06-29 | Disposition: A | Discharge status: Home / Self Care | Payer: 59 | Source: Ambulatory Visit | Attending: Family Medicine | Admitting: Family Medicine

## 2017-06-29 ENCOUNTER — Encounter: Payer: Self-pay | Admitting: Osteopathic Medicine

## 2017-06-29 VITALS — BP 131/93 | HR 89 | Ht 66.0 in | Wt 149.0 lb

## 2017-06-29 DIAGNOSIS — Z124 Encounter for screening for malignant neoplasm of cervix: Secondary | ICD-10-CM | POA: Insufficient documentation

## 2017-06-29 DIAGNOSIS — Z0000 Encounter for general adult medical examination without abnormal findings: Secondary | ICD-10-CM | POA: Diagnosis not present

## 2017-06-29 DIAGNOSIS — Z111 Encounter for screening for respiratory tuberculosis: Secondary | ICD-10-CM | POA: Diagnosis not present

## 2017-06-29 DIAGNOSIS — Z8659 Personal history of other mental and behavioral disorders: Secondary | ICD-10-CM | POA: Diagnosis not present

## 2017-06-29 MED ORDER — LAMOTRIGINE 100 MG PO TABS: 100.0000 mg | ORAL_TABLET | Freq: Every day | ORAL | 1 refills | Status: DC

## 2017-06-29 MED FILL — DEXTROAMP-AMP 10 MG TAB: 10 | 30 days supply | Qty: 30 | Fill #0

## 2017-06-29 MED FILL — VYVANSE 60 MG CAPSULE: 60 | 30 days supply | Qty: 30 | Fill #0

## 2017-06-29 MED FILL — lamoTRIgine 100 MG TABS: 100 | 90 days supply | Qty: 90 | Fill #0

## 2017-06-29 NOTE — Progress Notes (Signed)
HPI: Jenny Navarro is a 26 y.o. female who  has no past medical history on file.  she presents to [facility] today, 06/29/17,  for chief complaint of:  Chief Complaint  Patient presents with  . Annual Exam      Patient here for annual physical / wellness exam.  See preventive care reviewed as below.  Recent labs reviewed in detail with the patient.   Additional concerns today include:   History of Bipolar - would like to stop Wellbutrin and increase the Lamictal   Needs form filled out for school and TB screening, requests Quantiferon     Past medical, surgical, social and family history reviewed:  Patient Active Problem List   Diagnosis Date Noted  . Encounter for surveillance of injectable contraceptive 05/28/2017  . History of bipolar disorder 09/08/2016  . Breast lump in female 06/02/2016  . Adult attention deficit disorder 06/02/2016   No past surgical history on file.  Social History   Tobacco Use  . Smoking status: Never Smoker  . Smokeless tobacco: Never Used  Substance Use Topics  . Alcohol use: Not on file   No family history on file.   Current medication list and allergy/intolerance information reviewed:    Current Outpatient Medications  Medication Sig Dispense Refill  . amphetamine-dextroamphetamine (ADDERALL) 10 MG tablet Take 1 tablet (10 mg total) by mouth daily after lunch. 30 tablet 0  . buPROPion (WELLBUTRIN XL) 150 MG 24 hr tablet Take 1 tablet (150 mg total) by mouth daily. 90 tablet 1  . lamoTRIgine (LAMICTAL) 25 MG tablet Take 1 tablet (25 mg total) by mouth 2 (two) times daily. 180 tablet 3  . lisdexamfetamine (VYVANSE) 60 MG capsule Take 1 capsule (60 mg total) by mouth every morning. 30 capsule 0  . medroxyPROGESTERone (DEPO-PROVERA) 150 MG/ML injection Inject 1 mL (150 mg total) into the muscle every 3 (three) months. 1 mL 0   No current facility-administered medications for this visit.     Allergies   Allergen Reactions  . Penicillins       Review of Systems:  Constitutional:  No  fever, no chills, No recent illness, No unintentional weight changes. No significant fatigue.   HEENT: No  headache, no vision change, no hearing change, No sore throat, No  sinus pressure  Cardiac: No  chest pain, No  pressure, No palpitations, No  Orthopnea  Respiratory:  No  shortness of breath. No  Cough  Gastrointestinal: No  abdominal pain, No  nausea, No  vomiting,  No  blood in stool, No  diarrhea, No  constipation   Musculoskeletal: No new myalgia/arthralgia  Genitourinary: No  incontinence, No  abnormal genital bleeding, No abnormal genital discharge  Skin: No  Rash, No other wounds/concerning lesions  Hem/Onc: No  easy bruising/bleeding, No  abnormal lymph node  Endocrine: No cold intolerance,  No heat intolerance. No polyuria/polydipsia/polyphagia   Neurologic: No  weakness, No  dizziness, No  slurred speech/focal weakness/facial droop  Psychiatric: No  concerns with depression, No  concerns with anxiety, No sleep problems, No mood problems  Exam:  BP (!) 131/93   Pulse 89   Ht 5\' 6"  (1.676 m)   Wt 149 lb (67.6 kg)   BMI 24.05 kg/m   Constitutional: VS see above. General Appearance: alert, well-developed, well-nourished, NAD  Eyes: Normal lids and conjunctive, non-icteric sclera  Ears, Nose, Mouth, Throat: MMM, Normal external inspection ears/nares/mouth/lips/gums. TM normal bilaterally. Pharynx/tonsils no erythema, no exudate.  Nasal mucosa normal.   Neck: No masses, trachea midline. No thyroid enlargement. No tenderness/mass appreciated. No lymphadenopathy  Respiratory: Normal respiratory effort. no wheeze, no rhonchi, no rales  Cardiovascular: S1/S2 normal, no murmur, no rub/gallop auscultated. RRR. No lower extremity edema. Pedal pulse II/IV bilaterally DP and PT. No carotid bruit or JVD. No abdominal aortic bruit.  Gastrointestinal: Nontender, no masses. No  hepatomegaly, no splenomegaly. No hernia appreciated. Bowel sounds normal. Rectal exam deferred.   Musculoskeletal: Gait normal. No clubbing/cyanosis of digits.   Neurological: Normal balance/coordination. No tremor. No cranial nerve deficit on limited exam. Motor and sensation intact and symmetric. Cerebellar reflexes intact.   Skin: warm, dry, intact. No rash/ulcer. No concerning nevi or subq nodules on limited exam.    Psychiatric: Normal judgment/insight. Normal mood and affect. Oriented x3.  GYN: No lesions/ulcers to external genitalia, normal urethra, normal vaginal mucosa, physiologic discharge, cervix normal without lesions, uterus not enlarged or tender, adnexa no masses and nontender  BREAST: No rashes/skin changes, normal fibrous breast tissue, no masses or tenderness, normal nipple without discharge, normal axilla      ASSESSMENT/PLAN:   Annual physical exam - Plan: HIV antibody, TSH, Lipid panel, COMPLETE METABOLIC PANEL WITH GFR, CBC  History of bipolar disorder - Plan: lamoTRIgine (LAMICTAL) 100 MG tablet  Cervical cancer screening - Plan: Cytology - PAP  Screening-pulmonary TB - Plan: Quantiferon tb gold assay, C. trachomatis/N. gonorrhoeae RNA     FEMALE PREVENTIVE CARE Updated 06/29/17   ANNUAL SCREENING/COUNSELING  Diet/Exercise - HEALTHY HABITS DISCUSSED TO DECREASE CV RISK Social History   Tobacco Use  Smoking Status Never Smoker  Smokeless Tobacco Never Used   Social History   Substance and Sexual Activity  Alcohol Use Not on file   [facility]/2017  Decreased Interest 1  Down, Depressed, Hopeless 1  PHQ - 2 Score 2  Altered sleeping 0  Tired, decreased energy 0  Change in appetite 0  Feeling bad or failure about yourself  0  Trouble concentrating 0  Moving slowly or fidgety/restless 0  Suicidal thoughts 0  PHQ-9 Score 2    Domestic violence concerns - no  HTN SCREENING - SEE VITALS  SEXUAL HEALTH  Sexually  active in the past year - Yes with female.  Need/want STI testing today? - no  Concerns about libido or pain with sex? - no  Plans for pregnancy? - none at this time - Depo  INFECTIOUS DISEASE SCREENING  HIV - needs  GC/CT - needs  HepC - DOB [birth year] - does not need  TB - needs for school  DISEASE SCREENING  Lipid - needs  DM2 - needs  Osteoporosis - women age 67+ - does not need  CANCER SCREENING  Cervical - needs  Breast - does not need  Lung - does not need  Colon - does not need  ADULT VACCINATION  Influenza - annual vaccine recommended  Td - booster every 10 years   Zoster - Shingrix recommended 50+  PCV13 - was not indicated  PPSV23 - was not indicated Immunization History  Administered Date(s) Administered  . Influenza-Unspecified 06/17/2016    OTHER  Fall - exercise and Vit D age 67+ - does not need    Visit summary with medication list and pertinent instructions was printed for patient to review. All questions at time of visit were answered - patient instructed to contact office with any additional concerns. ER/RTC precautions were reviewed with the patient. Follow-up plan: Return for  when due for ADHD medication refills next month - we can see how increased Lamictal is working .   Please note: voice recognition software was used to produce this document, and typos may escape review. Please contact Dr. Lyn HollingsheadAlexander for any needed clarifications.

## 2017-06-30 LAB — QUANTIFERON TB GOLD ASSAY (BLOOD)
QUANTIFERON TB AG MINUS NIL: 0.01 [IU]/mL
QUANTIFERON(R)-TB GOLD: NEGATIVE
Quantiferon Nil Value: 0.04 IU/mL

## 2017-06-30 LAB — CBC
HCT: 43.2 % (ref 35.0–45.0)
Hemoglobin: 14.8 g/dL (ref 11.7–15.5)
MCH: 31.2 pg (ref 27.0–33.0)
MCHC: 34.3 g/dL (ref 32.0–36.0)
MCV: 90.9 fL (ref 80.0–100.0)
MPV: 10.3 fL (ref 7.5–12.5)
PLATELETS: 335 10*3/uL (ref 140–400)
RBC: 4.75 10*6/uL (ref 3.80–5.10)
RDW: 11.6 % (ref 11.0–15.0)
WBC: 8.4 10*3/uL (ref 3.8–10.8)

## 2017-06-30 LAB — COMPLETE METABOLIC PANEL WITH GFR
AG RATIO: 1.7 (calc) (ref 1.0–2.5)
ALBUMIN MSPROF: 4.6 g/dL (ref 3.6–5.1)
ALKALINE PHOSPHATASE (APISO): 68 U/L (ref 33–115)
ALT: 20 U/L (ref 6–29)
AST: 22 U/L (ref 10–30)
BILIRUBIN TOTAL: 0.6 mg/dL (ref 0.2–1.2)
BUN: 10 mg/dL (ref 7–25)
CHLORIDE: 103 mmol/L (ref 98–110)
CO2: 28 mmol/L (ref 20–32)
Calcium: 10 mg/dL (ref 8.6–10.2)
Creat: 0.7 mg/dL (ref 0.50–1.10)
GFR, EST AFRICAN AMERICAN: 138 mL/min/{1.73_m2} (ref >=60)
GFR, Est Non African American: 119 mL/min/{1.73_m2} (ref >=60)
Globulin: 2.7 g/dL (calc) (ref 1.9–3.7)
Glucose, Bld: 95 mg/dL (ref 65–99)
POTASSIUM: 3.8 mmol/L (ref 3.5–5.3)
Sodium: 136 mmol/L (ref 135–146)
TOTAL PROTEIN: 7.3 g/dL (ref 6.1–8.1)

## 2017-06-30 LAB — CYTOLOGY - PAP: DIAGNOSIS: NEGATIVE

## 2017-06-30 LAB — LIPID PANEL
Cholesterol: 197 mg/dL (ref <200)
HDL: 86 mg/dL (ref >50)
LDL CHOLESTEROL (CALC): 94 mg/dL
NON-HDL CHOLESTEROL (CALC): 111 mg/dL (ref <130)
TRIGLYCERIDES: 79 mg/dL (ref <150)
Total CHOL/HDL Ratio: 2.3 (calc) (ref <5.0)

## 2017-06-30 LAB — TSH: TSH: 1.73 m[IU]/L

## 2017-06-30 LAB — HIV ANTIBODY (ROUTINE TESTING W REFLEX): HIV 1&2 Ab, 4th Generation: NONREACTIVE

## 2017-07-01 LAB — C. TRACHOMATIS/N. GONORRHOEAE RNA
C. TRACHOMATIS RNA, TMA: NOT DETECTED
N. gonorrhoeae RNA, TMA: NOT DETECTED

## 2017-07-27 ENCOUNTER — Ambulatory Visit: Payer: Self-pay | Admitting: Osteopathic Medicine

## 2017-07-27 ENCOUNTER — Ambulatory Visit (INDEPENDENT_AMBULATORY_CARE_PROVIDER_SITE_OTHER): Payer: 59 | Admitting: Osteopathic Medicine

## 2017-07-27 ENCOUNTER — Encounter: Payer: Self-pay | Admitting: Osteopathic Medicine

## 2017-07-27 VITALS — BP 134/88 | HR 123 | Wt 153.0 lb

## 2017-07-27 DIAGNOSIS — F988 Other specified behavioral and emotional disorders with onset usually occurring in childhood and adolescence: Secondary | ICD-10-CM | POA: Diagnosis not present

## 2017-07-27 DIAGNOSIS — R000 Tachycardia, unspecified: Secondary | ICD-10-CM

## 2017-07-27 DIAGNOSIS — Z8659 Personal history of other mental and behavioral disorders: Secondary | ICD-10-CM | POA: Diagnosis not present

## 2017-07-27 HISTORY — DX: Tachycardia, unspecified: R00.0

## 2017-07-27 MED ORDER — LISDEXAMFETAMINE DIMESYLATE 60 MG PO CAPS: 60.0000 mg | ORAL_CAPSULE | ORAL | 0 refills | Status: DC

## 2017-07-27 MED ORDER — AMPHETAMINE-DEXTROAMPHETAMINE 10 MG PO TABS: 10.0000 mg | ORAL_TABLET | Freq: Every day | ORAL | 0 refills | Status: DC

## 2017-07-27 MED FILL — AMPHETAMINE-DEXTROAMPHETAMI: 10 | 90 days supply | Qty: 90 | Fill #0

## 2017-07-27 MED FILL — VYVANSE 60 MG CAPSULE: 60 | 90 days supply | Qty: 90 | Fill #0

## 2017-07-27 NOTE — Progress Notes (Signed)
HPI: Jenny GeorgeMeagan Navarro is a 26 y.o. female who  has no past medical history on file.  [facility], 07/27/17,  for chief complaint of:  Chief Complaint  Patient presents with  . ADHD    Bipolar d/o: Last visit, we stopped Wellbutrin and increased Lamictal. She is feeling well on this regimen.  Attention Deficit d/o: Vyvanse 60 qAM + Adderall 10 lunchtime   BP elevated today on intake. VS on recheck better but still tachycardic.     Past medical, surgical, social and family history reviewed:  Patient Active Problem List   Diagnosis Date Noted  . Encounter for surveillance of injectable contraceptive 05/28/2017  . History of bipolar disorder 09/08/2016  . Breast lump in female 06/02/2016  . Adult attention deficit disorder 06/02/2016   No past surgical history on file.  Social History   Tobacco Use  . Smoking status: Never Smoker  . Smokeless tobacco: Never Used  Substance Use Topics  . Alcohol use: Not on file   No family history on file.    Current medication list and allergy/intolerance information reviewed:    Current Outpatient Medications  Medication Sig Dispense Refill  . amphetamine-dextroamphetamine (ADDERALL) 10 MG tablet Take 1 tablet (10 mg total) by mouth daily after lunch. 30 tablet 0  . lamoTRIgine (LAMICTAL) 100 MG tablet Take 1 tablet (100 mg total) daily by mouth. 90 tablet 1  . lisdexamfetamine (VYVANSE) 60 MG capsule Take 1 capsule (60 mg total) by mouth every morning. 30 capsule 0  . medroxyPROGESTERone (DEPO-PROVERA) 150 MG/ML injection Inject 1 mL (150 mg total) into the muscle every 3 (three) months. 1 mL 0   No current facility-administered medications for this visit.     Allergies  Allergen Reactions  . Penicillins       Review of Systems:  Constitutional:  No  fever, no chills, No recent illness,.   HEENT: No  headache, no vision change  Cardiac: No  chest pain, No  pressure, No  palpitation  Respiratory:  No  shortness of breath. No  Cough  Gastrointestinal: No  abdominal pain, No  nausea  Musculoskeletal: No new myalgia/arthralgia  Skin: No  Rash  Neurologic: No  weakness, No  dizziness  Psychiatric: No  concerns with depression, No  concerns with anxiety, No sleep problems, No mood problems  Exam:  BP 134/88   Pulse (!) 123   Wt 153 lb (69.4 kg)   BMI 24.69 kg/m   Constitutional: VS see above. General Appearance: alert, well-developed, well-nourished, NAD  Ears, Nose, Mouth, Throat: MMM, Normal external inspection ears/nares/mouth/lips/gums  Neck: No masses, trachea midline.   Respiratory: Normal respiratory effort. no wheeze, no rhonchi, no rales  Cardiovascular: S1/S2 normal, no murmur, no rub/gallop auscultated. RRR on auscultation, rate about 90-100.   Musculoskeletal: Gait normal.   Neurological: Normal balance/coordination. No tremor.   Psychiatric: Normal judgment/insight. Normal mood and affect. Oriented x3.    No results found for this or any previous visit (from the past 72 hour(s)).  No results found.   ASSESSMENT/PLAN:   Adult attention deficit disorder - Plan: lisdexamfetamine (VYVANSE) 60 MG capsule, amphetamine-dextroamphetamine (ADDERALL) 10 MG tablet  History of bipolar disorder - doing well on current lamictal, she notes some increased anxiety but declines dose change for now. Prefers being off Wellbutrin.   Tachycardia - Has been ok in the past - no palpitations, no SOB, no dizziness. 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      Visit summary with medication list and pertinent instructions was printed for patient to review. All questions at time of visit were answered - patient instructed to contact office with any  additional concerns. ER/RTC precautions were reviewed with the patient. Follow-up plan: Return in about 3 months (around 10/25/2017) for refills, sooner if needed .  Note: Total time spent 25 minutes, greater than 50% of the visit was spent face-to-face counseling and coordinating care for the following: The primary encounter diagnosis was Adult attention deficit disorder. Diagnoses of History of bipolar disorder and Tachycardia were also pertinent to this visit.Marland Kitchen.  Please note: voice recognition software was used to produce this document, and typos may escape review. Please contact Dr. Lyn HollingsheadAlexander for any needed clarifications.

## 2017-07-27 NOTE — Patient Instructions (Addendum)
Plan:  OK to continue current regimen of medications - if anxiety or other symptoms become a problem, come see me!   I think this high blood pressure and the elevated heart rate is a fluke for today, your numbers have looked good in the past. We should be cautious, however, given the stimulant medications you're taking. Take BP and pulse at work - normal HR <100

## 2017-08-16 ENCOUNTER — Ambulatory Visit: Payer: Self-pay

## 2017-08-18 ENCOUNTER — Ambulatory Visit (INDEPENDENT_AMBULATORY_CARE_PROVIDER_SITE_OTHER): Payer: No Typology Code available for payment source | Admitting: Osteopathic Medicine

## 2017-08-18 VITALS — BP 119/68 | HR 78 | Temp 98.6°F | Resp 16 | Wt 151.2 lb

## 2017-08-18 DIAGNOSIS — Z3042 Encounter for surveillance of injectable contraceptive: Secondary | ICD-10-CM | POA: Diagnosis not present

## 2017-08-18 MED ORDER — MEDROXYPROGESTERONE ACETATE 150 MG/ML IM SUSP
150.0000 mg | Freq: Once | INTRAMUSCULAR | Status: AC
  Administered 2017-08-18: 150 mg via INTRAMUSCULAR

## 2017-08-18 NOTE — Progress Notes (Signed)
Pt is here for her Depo Provera injection.  Last injection: 05/31/17 Next injection: 08/16/17 - 08/30/17 - pt is within window.  Administered injection to LUQ. Pt tolerated injection with out any problems. Pt was advised next injection: 11/03/17 - 11/17/17. Pt was also given Depo Provera Perpetual Calendar.

## 2017-09-02 ENCOUNTER — Encounter: Payer: Self-pay | Admitting: Osteopathic Medicine

## 2017-09-02 DIAGNOSIS — F988 Other specified behavioral and emotional disorders with onset usually occurring in childhood and adolescence: Secondary | ICD-10-CM

## 2017-09-02 DIAGNOSIS — Z8659 Personal history of other mental and behavioral disorders: Secondary | ICD-10-CM

## 2017-09-08 ENCOUNTER — Telehealth: Payer: Self-pay | Admitting: Osteopathic Medicine

## 2017-09-08 DIAGNOSIS — F988 Other specified behavioral and emotional disorders with onset usually occurring in childhood and adolescence: Secondary | ICD-10-CM

## 2017-09-08 DIAGNOSIS — Z8659 Personal history of other mental and behavioral disorders: Secondary | ICD-10-CM

## 2017-09-08 MED ORDER — AMPHETAMINE-DEXTROAMPHETAMINE 10 MG PO TABS
10.0000 mg | ORAL_TABLET | Freq: Every day | ORAL | 0 refills | Status: DC
Start: 2017-09-08 — End: 2017-10-25

## 2017-09-08 MED ORDER — LISDEXAMFETAMINE DIMESYLATE 60 MG PO CAPS: 60.0000 mg | ORAL_CAPSULE | ORAL | 0 refills | Status: DC

## 2017-09-08 NOTE — Telephone Encounter (Signed)
See MyChart emails

## 2017-09-10 MED FILL — lamoTRIgine 100 MG TABS: 100 | 90 days supply | Qty: 90 | Fill #1

## 2017-09-27 MED FILL — VYVANSE 60 MG CAPSULE: 60 | 30 days supply | Qty: 30 | Fill #0

## 2017-09-27 MED FILL — AMPHETAMINE-DEXTROAMPHETAMI: 10 | 30 days supply | Qty: 30 | Fill #0

## 2017-10-25 ENCOUNTER — Encounter: Payer: Self-pay | Admitting: Osteopathic Medicine

## 2017-10-25 ENCOUNTER — Ambulatory Visit (INDEPENDENT_AMBULATORY_CARE_PROVIDER_SITE_OTHER): Payer: No Typology Code available for payment source | Admitting: Osteopathic Medicine

## 2017-10-25 VITALS — BP 135/90 | HR 75 | Temp 98.2°F | Wt 158.1 lb

## 2017-10-25 DIAGNOSIS — F988 Other specified behavioral and emotional disorders with onset usually occurring in childhood and adolescence: Secondary | ICD-10-CM

## 2017-10-25 DIAGNOSIS — Z8659 Personal history of other mental and behavioral disorders: Secondary | ICD-10-CM

## 2017-10-25 MED ORDER — LAMOTRIGINE 150 MG PO TABS: 150.0000 mg | ORAL_TABLET | Freq: Every day | ORAL | 1 refills | Status: DC

## 2017-10-25 MED ORDER — AMPHETAMINE-DEXTROAMPHETAMINE 10 MG PO TABS: 10.0000 mg | ORAL_TABLET | Freq: Every day | ORAL | 0 refills | Status: DC

## 2017-10-25 MED ORDER — LISDEXAMFETAMINE DIMESYLATE 60 MG PO CAPS: 60.0000 mg | ORAL_CAPSULE | ORAL | 0 refills | Status: DC

## 2017-10-25 MED FILL — VYVANSE 60 MG CAPSULE: 60 | 30 days supply | Qty: 30 | Fill #0

## 2017-10-25 MED FILL — lamoTRIgine 150 MG TABS: 150 | 90 days supply | Qty: 90 | Fill #0

## 2017-10-25 MED FILL — AMPHETAMINE-DEXTROAMPHETAMI: 10 | 90 days supply | Qty: 90 | Fill #0

## 2017-10-25 NOTE — Progress Notes (Signed)
HPI: Jenny Navarro is a 27 y.o. female who  has a past medical history of Adult attention deficit disorder (06/02/2016), History of bipolar disorder (09/08/2016), and Tachycardia (07/27/2017).  she presents to [facility] today, 10/25/17,  for chief complaint of:  Refill medications, follow-up moods and ADD    Bipolar d/o: Last visit, we stopped Wellbutrin and increased Lamictal. She is feeling well on this regimen but would like to go up on dose if possible .   Attention Deficit d/o: Vyvanse 60 qAM + Adderall 10 lunchtime. Written 09/08/17, filled 09/27/17.   BP elevated today on intake. VS on recheck better, she has a test coming up today. She is a Psychologist, sport and exercise ni nursing school.     Past medical, surgical, social and family history reviewed:  Patient Active Problem List   Diagnosis Date Noted  . Tachycardia 07/27/2017  . Encounter for surveillance of injectable contraceptive 05/28/2017  . History of bipolar disorder 09/08/2016  . Breast lump in female 06/02/2016  . Adult attention deficit disorder 06/02/2016   No past surgical history on file.  Social History   Tobacco Use  . Smoking status: Never Smoker  . Smokeless tobacco: Never Used  Substance Use Topics  . Alcohol use: Not on file   No family history on file.    Current medication list and allergy/intolerance information reviewed:    Current Outpatient Medications  Medication Sig Dispense Refill  . lamoTRIgine (LAMICTAL) 100 MG tablet Take 1 tablet (100 mg total) daily by mouth. 90 tablet 1  . medroxyPROGESTERone (DEPO-PROVERA) 150 MG/ML injection Inject 1 mL (150 mg total) into the muscle every 3 (three) months. 1 mL 0  . amphetamine-dextroamphetamine (ADDERALL) 10 MG tablet Take 1 tablet (10 mg total) by mouth daily after lunch. 30 tablet 0  . lisdexamfetamine (VYVANSE) 60 MG capsule Take 1 capsule (60 mg total) by mouth every morning. 30 capsule 0   No current  facility-administered medications for this visit.     Allergies  Allergen Reactions  . Penicillins       Review of Systems:  Constitutional:  No  fever, no chills, No recent illness,.   HEENT: No  headache, no vision change  Cardiac: No  chest pain, No  pressure, No palpitation  Respiratory:  No  shortness of breath. No  Cough  Gastrointestinal: No  abdominal pain, No  nausea  Musculoskeletal: No new myalgia/arthralgia  Skin: No  Rash  Neurologic: No  weakness, No  dizziness  Psychiatric: No  concerns with depression, No  concerns with anxiety, No sleep problems, No mood problems  Exam:  BP (!) 140/91   Pulse 75   Temp 98.2 F (36.8 C) (Oral)   Wt 158 lb 1.9 oz (71.7 kg)   BMI 25.52 kg/m   Constitutional: VS see above. General Appearance: alert, well-developed, well-nourished, NAD  Ears, Nose, Mouth, Throat: MMM, Normal external inspection ears/nares/mouth/lips/gums  Neck: No masses, trachea midline.   Respiratory: Normal respiratory effort. no wheeze, no rhonchi, no rales  Cardiovascular: S1/S2 normal, no murmur, no rub/gallop auscultated. RRR on auscultation, rate about 90-100.   Musculoskeletal: Gait normal.   Neurological: Normal balance/coordination. No tremor.   Psychiatric: Normal judgment/insight. Normal mood and affect. Oriented x3.    No results found for this or any previous visit (from the past 72 hour(s)).  No results found.   ASSESSMENT/PLAN:   Adult attention deficit disorder - Plan: amphetamine-dextroamphetamine (ADDERALL) 10 MG tablet, lisdexamfetamine (VYVANSE) 60 MG capsule  History of bipolar disorder - Plan: lamoTRIgine (LAMICTAL) 150 MG tablet - increased from 100 mg     Patient Instructions  130/80 goal blood pressure     Visit summary with medication list and pertinent instructions was printed for patient to review. All questions at time of visit were answered - patient instructed to contact office with any additional  concerns. ER/RTC precautions were reviewed with the patient.   Follow-up plan: Return in about 3 months (around 01/25/2018) for refill ADHD medications, sooner if needed .  Note: Total time spent 25 minutes, greater than 50% of the visit was spent face-to-face counseling and coordinating care for the following: The primary encounter diagnosis was Adult attention deficit disorder. A diagnosis of History of bipolar disorder was also pertinent to this visit.Marland Kitchen.  Please note: voice recognition software was used to produce this document, and typos may escape review. Please contact Dr. Lyn HollingsheadAlexander for any needed clarifications.

## 2017-10-25 NOTE — Patient Instructions (Signed)
130/80 goal blood pressure

## 2017-11-04 ENCOUNTER — Ambulatory Visit (INDEPENDENT_AMBULATORY_CARE_PROVIDER_SITE_OTHER): Payer: No Typology Code available for payment source | Admitting: Osteopathic Medicine

## 2017-11-04 VITALS — BP 125/80 | HR 78 | Temp 97.9°F | Resp 16 | Wt 160.1 lb

## 2017-11-04 DIAGNOSIS — Z3042 Encounter for surveillance of injectable contraceptive: Secondary | ICD-10-CM | POA: Diagnosis not present

## 2017-11-04 MED ORDER — MEDROXYPROGESTERONE ACETATE 150 MG/ML IM SUSP
150.0000 mg | Freq: Once | INTRAMUSCULAR | Status: AC
  Administered 2017-11-04: 150 mg via INTRAMUSCULAR

## 2017-11-04 NOTE — Progress Notes (Signed)
HPI: Patient is here for a Depo Provera injection. Patient denies chest pain, shortness of breath, headaches, mood changes or medication problems. Patient last injection: 08/18/17 - LUOQ. Window: 11/03/17 - 11/17/17 - patient is within the time frame.  Assessment and Plan: Patient tolerated injection - RUOQ - well without complications. Patient advised to schedule in 12 weeks. Depo Provera Calendar given as well.

## 2017-11-24 MED FILL — VYVANSE 60 MG CAPSULE: 60 | 30 days supply | Qty: 30 | Fill #1

## 2017-12-23 ENCOUNTER — Encounter: Payer: Self-pay | Admitting: Osteopathic Medicine

## 2017-12-23 DIAGNOSIS — F988 Other specified behavioral and emotional disorders with onset usually occurring in childhood and adolescence: Secondary | ICD-10-CM

## 2017-12-24 MED ORDER — LISDEXAMFETAMINE DIMESYLATE 60 MG PO CAPS: 60.0000 mg | ORAL_CAPSULE | ORAL | 0 refills | Status: DC

## 2017-12-24 MED FILL — VYVANSE 60 MG CAPSULE: 60 | 30 days supply | Qty: 30 | Fill #0

## 2018-01-21 ENCOUNTER — Ambulatory Visit (INDEPENDENT_AMBULATORY_CARE_PROVIDER_SITE_OTHER): Payer: No Typology Code available for payment source | Admitting: Osteopathic Medicine

## 2018-01-21 ENCOUNTER — Encounter: Payer: Self-pay | Admitting: Osteopathic Medicine

## 2018-01-21 VITALS — BP 120/80 | HR 67 | Temp 97.9°F | Wt 163.0 lb

## 2018-01-21 DIAGNOSIS — Z789 Other specified health status: Secondary | ICD-10-CM | POA: Diagnosis not present

## 2018-01-21 DIAGNOSIS — F988 Other specified behavioral and emotional disorders with onset usually occurring in childhood and adolescence: Secondary | ICD-10-CM

## 2018-01-21 DIAGNOSIS — Z8659 Personal history of other mental and behavioral disorders: Secondary | ICD-10-CM

## 2018-01-21 DIAGNOSIS — IMO0001 Reserved for inherently not codable concepts without codable children: Secondary | ICD-10-CM

## 2018-01-21 MED ORDER — LISDEXAMFETAMINE DIMESYLATE 60 MG PO CAPS: 60.0000 mg | ORAL_CAPSULE | ORAL | 0 refills | Status: DC

## 2018-01-21 MED ORDER — MEDROXYPROGESTERONE ACETATE 150 MG/ML IM SUSP
150.0000 mg | Freq: Once | INTRAMUSCULAR | Status: AC
  Administered 2018-01-21: 150 mg via INTRAMUSCULAR

## 2018-01-21 MED ORDER — AMPHETAMINE-DEXTROAMPHETAMINE 15 MG PO TABS: 15.0000 mg | ORAL_TABLET | Freq: Every day | ORAL | 0 refills | Status: DC

## 2018-01-21 MED FILL — lamoTRIgine 150 MG TABS: 150 | 90 days supply | Qty: 90 | Fill #1

## 2018-01-21 MED FILL — AMPHETAMINE SALTS 15 MG TAB: 15 | 30 days supply | Qty: 30 | Fill #0

## 2018-01-21 MED FILL — VYVANSE 60 MG CAPSULE: 60 | 30 days supply | Qty: 30 | Fill #0

## 2018-01-21 NOTE — Patient Instructions (Signed)
Let's try increasing the Adderall to 15 mg from 10 mg. 30 days sent - let me know how that's working for you, and if doing well we can refill x3 months total. If not and you are concerned about needing increasing dose, please come see us!

## 2018-01-21 NOTE — Progress Notes (Signed)
HPI: Jenny Navarro is a 27 y.o. female who  has a past medical history of Adult attention deficit disorder (06/02/2016), History of bipolar disorder (09/08/2016), and Tachycardia (07/27/2017).  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  Bipolar d/o: 08/2016, we stopped Wellbutrin and increased Lamictal to 100 mg. She was feeling well on this regimen 10/2017 but wanted to increase dose Lamictal, so we went from 100 mg to 150 mg Lamictal.  She reports doing well on this medication as of today, 01/21/2018.   Attention Deficit d/o: Vyvanse 60 qAM + Adderall 10 lunchtime.  She states that the afternoon dose seems to wear off pretty quickly and does not work as well as she hoped.   PMP reviewed:  Vyvanse written 12/24/17 filled 12/24/17 Adderall IR written 10/25/17 filled 10/25/17 x90 tabs    Past medical, surgical, social and family history reviewed:  Patient Active Problem List   Diagnosis Date Noted  . Tachycardia 07/27/2017  . Encounter for surveillance of injectable contraceptive 05/28/2017  . History of bipolar disorder 09/08/2016  . Breast lump in female 06/02/2016  . Adult attention deficit disorder 06/02/2016   No past surgical history on file.  Social History   Tobacco Use  . Smoking status: Never Smoker  . Smokeless tobacco: Never Used  Substance Use Topics  . Alcohol use: Not on file   No family history on file.    Current medication list and allergy/intolerance information reviewed:    Current Outpatient Medications  Medication Sig Dispense Refill  . amphetamine-dextroamphetamine (ADDERALL) 10 MG tablet Take 1 tablet (10 mg total) by mouth daily after lunch. 90 tablet 0  . lamoTRIgine (LAMICTAL) 150 MG tablet Take 1 tablet (150 mg total) by mouth  daily. 90 tablet 1  . lisdexamfetamine (VYVANSE) 60 MG capsule Take 1 capsule (60 mg total) by mouth every morning. 30 capsule 0  . medroxyPROGESTERone (DEPO-PROVERA) 150 MG/ML injection Inject 1 mL (150 mg total) into the muscle every 3 (three) months. 1 mL 0   No current facility-administered medications for this visit.     Allergies  Allergen Reactions  . Penicillins       Review of Systems:  Constitutional:  No  fever, no chills, No recent illness,.   HEENT: No  headache, no vision change  Cardiac: No  chest pain, No  pressure, No palpitation  Respiratory:  No  shortness of breath. No  Cough  Gastrointestinal: No  abdominal pain, No  nausea  Psychiatric: +concerns with depression, +concerns with anxiety, No sleep problems, No mood problems  Exam:  BP 120/80   Pulse 67   Temp 97.9 F (36.6 C) (Oral)   Wt 163 lb (73.9 kg)   BMI 26.31 kg/m   Constitutional: VS see above. General Appearance: alert, well-developed, well-nourished, NAD   Ears, Nose, Mouth, Throat: MMM, Normal external inspection ears/nares/mouth/lips/gums  Neck: No masses, trachea midline.   Respiratory: Normal respiratory effort. no wheeze, no rhonchi, no rales  Cardiovascular: S1/S2 normal, no murmur, no rub/gallop auscultated. RRR on auscultation, rate about 90-100.   Musculoskeletal: Gait normal.   Neurological: Normal balance/coordination. No tremor.   Psychiatric: Normal judgment/insight. Normal mood and affect. Oriented x3.      ASSESSMENT/PLAN:   Adult attention deficit disorder - Plan: amphetamine-dextroamphetamine (ADDERALL) 15 MG tablet,  lisdexamfetamine (VYVANSE) 60 MG capsule, DISCONTINUED: lisdexamfetamine (VYVANSE) 60 MG capsule, DISCONTINUED: lisdexamfetamine (VYVANSE) 60 MG capsule, DISCONTINUED: lisdexamfetamine (VYVANSE) 60 MG capsule  History of bipolar disorder  Contraception - Plan: medroxyPROGESTERone (DEPO-PROVERA) injection 150 mg     Patient Instructions  Let's  try increasing the Adderall to 15 mg from 10 mg. 30 days sent - let me know how that's working for you, and if doing well we can refill x3 months total. If not and you are concerned about needing increasing dose, please come see us!     Visit summary with medication list and pertinent instructions was printed for patient to review. All questions at time of visit were answered - patient instructed to contact office with any additional concerns. ER/RTC precautions were reviewed with the patient.   Follow-up plan: Return in about 3 months (around 04/23/2018) for refill ADHD meds and get Deop shot .  Note: Total time spent 25 minutes, greater than 50% of the visit was spent face-to-face counseling and coordinating care for the following: The primary encounter diagnosis was Adult attention deficit disorder. Diagnoses of History of bipolar disorder and Contraception were also pertinent to this visit.Marland Kitchen.  Please note: voice recognition software was used to produce this document, and typos may escape review. Please contact Dr. Lyn HollingsheadAlexander for any needed clarifications.

## 2018-01-25 ENCOUNTER — Ambulatory Visit: Payer: Self-pay | Admitting: Osteopathic Medicine

## 2018-02-07 ENCOUNTER — Encounter: Payer: Self-pay | Admitting: Osteopathic Medicine

## 2018-02-07 DIAGNOSIS — F339 Major depressive disorder, recurrent, unspecified: Secondary | ICD-10-CM

## 2018-02-07 MED ORDER — BUPROPION HCL ER (XL) 150 MG PO TB24: 150.0000 mg | ORAL_TABLET | Freq: Every day | ORAL | 1 refills | Status: DC

## 2018-02-07 MED FILL — BUPROPION HCL XL 150 MG TAB: 150 | 90 days supply | Qty: 90 | Fill #0

## 2018-02-16 ENCOUNTER — Encounter: Payer: Self-pay | Admitting: Osteopathic Medicine

## 2018-02-16 DIAGNOSIS — F988 Other specified behavioral and emotional disorders with onset usually occurring in childhood and adolescence: Secondary | ICD-10-CM

## 2018-02-18 MED ORDER — LISDEXAMFETAMINE DIMESYLATE 60 MG PO CAPS: 60.0000 mg | ORAL_CAPSULE | ORAL | 0 refills | Status: DC

## 2018-02-18 MED ORDER — AMPHETAMINE-DEXTROAMPHETAMINE 15 MG PO TABS: 15.0000 mg | ORAL_TABLET | Freq: Every day | ORAL | 0 refills | Status: DC

## 2018-02-18 MED FILL — DEXTROAMP-AMPHETAMIN 15 MG: 15 | 30 days supply | Qty: 30 | Fill #0

## 2018-02-18 MED FILL — VYVANSE 60 MG CAPSULE: 60 | 30 days supply | Qty: 30 | Fill #0

## 2018-02-18 NOTE — Addendum Note (Signed)
Addended by: Nani GasserMETHENEY, CATHERINE D on: 02/18/2018 05:22 PM   Modules accepted: Orders

## 2018-03-22 ENCOUNTER — Other Ambulatory Visit: Payer: Self-pay | Admitting: Family Medicine

## 2018-03-22 DIAGNOSIS — F988 Other specified behavioral and emotional disorders with onset usually occurring in childhood and adolescence: Secondary | ICD-10-CM

## 2018-03-22 MED FILL — VYVANSE 60 MG CAPSULE: 60 | 30 days supply | Qty: 30 | Fill #0

## 2018-03-22 MED FILL — DEXTROAMP-AMPHETAMIN 15 MG: 15 | 30 days supply | Qty: 30 | Fill #0

## 2018-04-08 ENCOUNTER — Ambulatory Visit: Payer: Self-pay

## 2018-04-13 ENCOUNTER — Ambulatory Visit (INDEPENDENT_AMBULATORY_CARE_PROVIDER_SITE_OTHER): Payer: No Typology Code available for payment source | Admitting: Osteopathic Medicine

## 2018-04-13 DIAGNOSIS — Z23 Encounter for immunization: Secondary | ICD-10-CM

## 2018-04-13 DIAGNOSIS — Z3042 Encounter for surveillance of injectable contraceptive: Secondary | ICD-10-CM

## 2018-04-13 MED ORDER — MEDROXYPROGESTERONE ACETATE 150 MG/ML IM SUSP
150.0000 mg | Freq: Once | INTRAMUSCULAR | Status: AC
Start: 2018-04-13 — End: 2018-04-13
  Administered 2018-04-13: 150 mg via INTRAMUSCULAR

## 2018-04-13 NOTE — Progress Notes (Signed)
Pt came into clinic today for Depo shot. She is in range for injection window. Reports no negative side effects from depo shot, states she does not have a menstrual cycle now. 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 Tolerated depo in RUOQ and flu in Rt deltoid today well, no immediate complications. Printed copy of flu shot provided. No further questions.

## 2018-04-19 ENCOUNTER — Ambulatory Visit: Payer: Self-pay | Admitting: Osteopathic Medicine

## 2018-04-21 ENCOUNTER — Ambulatory Visit (INDEPENDENT_AMBULATORY_CARE_PROVIDER_SITE_OTHER): Payer: No Typology Code available for payment source | Admitting: Osteopathic Medicine

## 2018-04-21 ENCOUNTER — Other Ambulatory Visit: Payer: Self-pay | Admitting: Osteopathic Medicine

## 2018-04-21 ENCOUNTER — Encounter: Payer: Self-pay | Admitting: Osteopathic Medicine

## 2018-04-21 VITALS — BP 126/91 | HR 84 | Temp 98.2°F | Wt 169.3 lb

## 2018-04-21 DIAGNOSIS — J069 Acute upper respiratory infection, unspecified: Secondary | ICD-10-CM | POA: Diagnosis not present

## 2018-04-21 DIAGNOSIS — F988 Other specified behavioral and emotional disorders with onset usually occurring in childhood and adolescence: Secondary | ICD-10-CM | POA: Diagnosis not present

## 2018-04-21 DIAGNOSIS — Z8659 Personal history of other mental and behavioral disorders: Secondary | ICD-10-CM

## 2018-04-21 MED ORDER — LISDEXAMFETAMINE DIMESYLATE 70 MG PO CAPS: 70.0000 mg | ORAL_CAPSULE | ORAL | 0 refills | Status: DC

## 2018-04-21 MED ORDER — AMPHETAMINE-DEXTROAMPHETAMINE 15 MG PO TABS: 15.0000 mg | ORAL_TABLET | Freq: Every day | ORAL | 0 refills | Status: DC

## 2018-04-21 MED FILL — DEXTROAMP-AMPHETAMIN 15 MG: 15 | 90 days supply | Qty: 90 | Fill #0

## 2018-04-21 MED FILL — VYVANSE 70 MG CAPSULE: 70 | 30 days supply | Qty: 30 | Fill #0

## 2018-04-21 NOTE — Progress Notes (Signed)
HPI: Jenny Navarro is a 27 y.o. female who  has a past medical history of Adult attention deficit disorder (06/02/2016), History of bipolar disorder (09/08/2016), and Tachycardia (07/27/2017).  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  Bipolar d/o: 08/2016, we stopped Wellbutrin and increased Lamictal to 100 mg. She was feeling well on this regimen 10/2017 but wanted to increase dose Lamictal, so we went from 100 mg to 150 mg Lamictal.  She reports doing well on this medication as of today, 04/21/18   Attention Deficit d/o: Vyvanse 60 qAM + Adderall 10 lunchtime.  She states that the afternoon dose seems to be working okay, the lower dose of the Vyvanse has caused her to really struggle with school, she would like to go back up to the 70 mg if possible. PMP reviewed:  Vyvanse written 01/21/18  filled 03/22/18 x30 tabs Adderall IR written 03/22/18 filled 03/22/18 x30 tabs  Reports sinus congestion and feeling a bit under the weather this weekend.  No fevers, no cough or shortness of breath.  Requests note for clearance to return to work/school.     Past medical, surgical, social and family history reviewed:  Patient Active Problem List   Diagnosis Date Noted  . Tachycardia 07/27/2017  . Encounter for surveillance of injectable contraceptive 05/28/2017  . History of bipolar disorder 09/08/2016  . Breast lump in female 06/02/2016  . Adult attention deficit disorder 06/02/2016   No past surgical history on file.  Social History   Tobacco Use  . Smoking status: Never Smoker  . Smokeless tobacco: Never Used  Substance Use Topics  . Alcohol use: Not on file   No family history on file.     Current medication  list and allergy/intolerance information reviewed:    Current Outpatient Medications  Medication Sig Dispense Refill  . amphetamine-dextroamphetamine (ADDERALL) 15 MG tablet TAKE 1 TABLET BY MOUTH DAILY AFTER LUNCH. 30 tablet 0  . buPROPion (WELLBUTRIN XL) 150 MG 24 hr tablet Take 1 tablet (150 mg total) by mouth daily. 90 tablet 1  . lamoTRIgine (LAMICTAL) 150 MG tablet Take 1 tablet (150 mg total) by mouth daily. 90 tablet 1  . lisdexamfetamine (VYVANSE) 60 MG capsule Take 1 capsule (60 mg total) by mouth every morning. 30 capsule 0  . medroxyPROGESTERone (DEPO-PROVERA) 150 MG/ML injection Inject 1 mL (150 mg total) into the muscle every 3 (three) months. 1 mL 0   No current facility-administered medications for this visit.     Allergies  Allergen Reactions  . Penicillins       Review of Systems:  Constitutional:  No  fever, no chills, No recent illness,.   HEENT: No  headache, no vision change  Cardiac: No  chest pain, No  pressure, No palpitation  Respiratory:  No  shortness of breath. No  Cough  Gastrointestinal: No  abdominal pain, No  nausea  Psychiatric: No concerns with depression, +concerns with anxiety, No sleep problems, No mood problems  Exam:  BP (!) 126/91 (BP Location: Left Arm, Patient Position: Sitting, Cuff Size: Normal)   Pulse 84   Temp 98.2 F (36.8 C) (Oral)   Wt 169 lb 4.8 oz (76.8 kg)   BMI 27.33 kg/m   Constitutional: VS  see above. General Appearance: alert, well-developed, well-nourished, NAD   Ears, Nose, Mouth, Throat: MMM, Normal external inspection ears/nares/mouth/lips/gums.  Normal pharynx, mild erythema right tonsil, will nasal mucosa, normal tympanic membranes bilaterally  Neck: No masses, trachea midline.  No Lymphadenopathy  Respiratory: Normal respiratory effort. no wheeze, no rhonchi, no rales  Cardiovascular: S1/S2 normal, no murmur, no rub/gallop auscultated. RRR on auscultation, rate about 90-100.   Musculoskeletal: Gait  normal.   Neurological: Normal balance/coordination. No tremor.   Psychiatric: Normal judgment/insight. Normal mood and affect. Oriented x3.      ASSESSMENT/PLAN:   Adult attention deficit disorder - Plan: amphetamine-dextroamphetamine (ADDERALL) 15 MG tablet, lisdexamfetamine (VYVANSE) 70 MG capsule, DISCONTINUED: lisdexamfetamine (VYVANSE) 70 MG capsule, DISCONTINUED: lisdexamfetamine (VYVANSE) 70 MG capsule  Viral URI - Symptoms certainly not consistent with influenza, though we do not have swabs here in office.  See letters/communications       Visit summary with medication list and pertinent instructions was printed for patient to review. All questions at time of visit were answered - patient instructed to contact office with any additional concerns. ER/RTC precautions were reviewed with the patient.   Follow-up plan: Return for Refills/Depo shot in 3 months.  Note: Total time spent 25 minutes, greater than 50% of the visit was spent face-to-face counseling and coordinating care for the following: The primary encounter diagnosis was Adult attention deficit disorder. A diagnosis of Viral URI was also pertinent to this visit.Marland Kitchen  Please note: voice recognition software was used to produce this document, and typos may escape review. Please contact Dr. Lyn Hollingshead for any needed clarifications.

## 2018-05-09 MED FILL — buPROPion HCL ER (XL) 150 M: 150 | 90 days supply | Qty: 90 | Fill #1

## 2018-05-23 MED FILL — VYVANSE 70 MG CAPSULE: 70 | 30 days supply | Qty: 30 | Fill #0

## 2018-05-23 MED FILL — lamoTRIgine 150 MG TABS: 150 | 90 days supply | Qty: 90 | Fill #0

## 2018-06-20 MED FILL — VYVANSE 70 MG CAPSULE: 70 | 30 days supply | Qty: 30 | Fill #0

## 2018-06-21 ENCOUNTER — Telehealth: Payer: Self-pay | Admitting: Osteopathic Medicine

## 2018-06-21 DIAGNOSIS — Z111 Encounter for screening for respiratory tuberculosis: Secondary | ICD-10-CM

## 2018-06-21 NOTE — Telephone Encounter (Signed)
Pt called.  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  Thanks.

## 2018-06-21 NOTE — Telephone Encounter (Signed)
Signed order.

## 2018-06-21 NOTE — Telephone Encounter (Signed)
Pt advised. She already went somewhere else to have this done. Order cancelled.

## 2018-06-21 NOTE — Telephone Encounter (Signed)
Lab pended. Routing to PCP for review.

## 2018-07-01 ENCOUNTER — Ambulatory Visit (INDEPENDENT_AMBULATORY_CARE_PROVIDER_SITE_OTHER): Payer: No Typology Code available for payment source | Admitting: Physician Assistant

## 2018-07-01 VITALS — BP 108/71 | HR 72

## 2018-07-01 DIAGNOSIS — Z3042 Encounter for surveillance of injectable contraceptive: Secondary | ICD-10-CM | POA: Diagnosis not present

## 2018-07-01 MED ORDER — MEDROXYPROGESTERONE ACETATE 150 MG/ML IM SUSP
150.0000 mg | Freq: Once | INTRAMUSCULAR | Status: AC
  Administered 2018-07-01: 150 mg via INTRAMUSCULAR

## 2018-07-01 MED ORDER — CYANOCOBALAMIN 1000 MCG/ML IJ SOLN: 1000.0000 ug | Freq: Once | INTRAMUSCULAR | Status: DC

## 2018-07-01 NOTE — Progress Notes (Signed)
Established Patient Office Visit  Subjective:  Patient ID: Jenny Navarro Betzold, female    DOB: 06/21/1991  Age: 27 y.o. MRN: [id number]  CC:  Chief Complaint  Patient presents with  . Contraception    HPI Jenny Navarro Glomb presents for a Depo Provera injection. Denies chest pain, shortness of breath, headaches, mood changes or problems with medication. It has been < 14 weeks since her last Depo Provera injection.  Past Medical History:  Diagnosis Date  . Adult attention deficit disorder 06/02/2016   Awaiting records as of 06/02/2016  . History of bipolar disorder 09/08/2016  . Tachycardia 07/27/2017    No past surgical history on file.  No family history on file.  Social History   Socioeconomic History  . Marital status: Single    Spouse name: Not on file  . Number of children: Not on file  . Years of education: Not on file  . Highest education level: Not on file  Occupational History  . Not on file  Social Needs  . Financial resource strain: Not on file  . Food insecurity:    Worry: Not on file    Inability: Not on file  . Transportation needs:    Medical: Not on file    Non-medical: Not on file  Tobacco Use  . Smoking status: Never Smoker  . Smokeless tobacco: Never Used  Substance and Sexual Activity  . Alcohol use: Not on file  . Drug use: Not on file  . Sexual activity: Not on file  Lifestyle  . Physical activity:    Days per week: Not on file    Minutes per session: Not on file  . Stress: Not on file  Relationships  . Social connections:    Talks on phone: Not on file    Gets together: Not on file    Attends religious service: Not on file    Active member of club or organization: Not on file    Attends meetings of clubs or organizations: Not on file    Relationship status: Not on file  . Intimate partner violence:    Fear of current or ex partner: Not on file    Emotionally abused: Not on file    Physically abused: Not on file    Forced sexual activity: Not  on file  Other Topics Concern  . Not on file  Social History Narrative  . Not on file    Outpatient Medications Prior to Visit  Medication Sig Dispense Refill  . amphetamine-dextroamphetamine (ADDERALL) 15 MG tablet Take 1 tablet by mouth daily after lunch. 90 tablet 0  . buPROPion (WELLBUTRIN XL) 150 MG 24 hr tablet Take 1 tablet (150 mg total) by mouth daily. 90 tablet 1  . lamoTRIgine (LAMICTAL) 150 MG tablet TAKE 1 TABLET BY MOUTH DAILY. 90 tablet 0  . lisdexamfetamine (VYVANSE) 70 MG capsule Take 1 capsule (70 mg total) by mouth every morning. 30 capsule 0  . medroxyPROGESTERone (DEPO-PROVERA) 150 MG/ML injection Inject 1 mL (150 mg total) into the muscle every 3 (three) months. 1 mL 0   No facility-administered medications prior to visit.     Allergies  Allergen Reactions  . Penicillins     ROS Review of Systems    Objective:    Physical Exam  BP 108/71   Pulse 72   SpO2 100%  Wt Readings from Last 3 Encounters:  04/21/18 169 lb 4.8 oz (76.8 kg)  01/21/18 163 lb (73.9 kg)  11/04/17 160 lb 1.6  oz (72.6 kg)     There are no preventive care reminders to display for this patient.  There are no preventive care reminders to display for this patient.  Lab Results  Component Value Date   TSH 1.73 06/29/2017   Lab Results  Component Value Date   WBC 8.4 06/29/2017   HGB 14.8 06/29/2017   HCT 43.2 06/29/2017   MCV 90.9 06/29/2017   PLT 335 06/29/2017   Lab Results  Component Value Date   NA 136 06/29/2017   K 3.8 06/29/2017   CO2 28 06/29/2017   GLUCOSE 95 06/29/2017   BUN 10 06/29/2017   CREATININE 0.70 06/29/2017   BILITOT 0.6 06/29/2017   ALKPHOS 74 06/02/2016   AST 22 06/29/2017   ALT 20 06/29/2017   PROT 7.3 06/29/2017   ALBUMIN 4.1 06/02/2016   CALCIUM 10.0 06/29/2017   Lab Results  Component Value Date   CHOL 197 06/29/2017   Lab Results  Component Value Date   HDL 86 06/29/2017   Lab Results  Component Value Date   LDLCALC 94  06/29/2017   Lab Results  Component Value Date   TRIG 79 06/29/2017   Lab Results  Component Value Date   CHOLHDL 2.3 06/29/2017   No results found for: HGBA1C    Assessment & Plan:  Patient tolerated injection well without complications. Patient advised to schedule next injection 12-13 weeks from today.   Problem List Items Addressed This Visit    Encounter for surveillance of injectable contraceptive - Primary   Relevant Medications   medroxyPROGESTERone (DEPO-PROVERA) injection 150 mg (Completed) (Start on 07/01/2018 10:15 AM)      Meds ordered this encounter  Medications  . DISCONTD: cyanocobalamin ((VITAMIN B-12)) injection 1,000 mcg  . medroxyPROGESTERone (DEPO-PROVERA) injection 150 mg    Follow-up: Return in about 12 weeks (around 09/23/2018) for next depo provera injection. Earna Coder, Janalyn Harder, CMA   Agree with above plan. Jade Breeback PA-C.

## 2018-07-20 ENCOUNTER — Encounter: Payer: Self-pay | Admitting: Osteopathic Medicine

## 2018-07-20 ENCOUNTER — Ambulatory Visit (INDEPENDENT_AMBULATORY_CARE_PROVIDER_SITE_OTHER): Payer: No Typology Code available for payment source | Admitting: Osteopathic Medicine

## 2018-07-20 VITALS — BP 132/84 | HR 71 | Temp 98.1°F | Wt 178.0 lb

## 2018-07-20 DIAGNOSIS — R748 Abnormal levels of other serum enzymes: Secondary | ICD-10-CM

## 2018-07-20 DIAGNOSIS — F3342 Major depressive disorder, recurrent, in full remission: Secondary | ICD-10-CM | POA: Diagnosis not present

## 2018-07-20 DIAGNOSIS — Z0000 Encounter for general adult medical examination without abnormal findings: Secondary | ICD-10-CM

## 2018-07-20 DIAGNOSIS — F988 Other specified behavioral and emotional disorders with onset usually occurring in childhood and adolescence: Secondary | ICD-10-CM

## 2018-07-20 MED ORDER — LISDEXAMFETAMINE DIMESYLATE 70 MG PO CAPS: 70.0000 mg | ORAL_CAPSULE | ORAL | 0 refills | Status: DC

## 2018-07-20 MED ORDER — AMPHETAMINE-DEXTROAMPHETAMINE 15 MG PO TABS: 15.0000 mg | ORAL_TABLET | Freq: Every day | ORAL | 0 refills | Status: DC

## 2018-07-20 MED FILL — VYVANSE 70 MG CAPSULE: 70 | 30 days supply | Qty: 30 | Fill #0

## 2018-07-20 MED FILL — DEXTROAMP-AMPHETAMIN 15 MG: 15 | 90 days supply | Qty: 90 | Fill #0

## 2018-07-20 NOTE — Progress Notes (Signed)
HPI: Jenny Navarro is a 27 y.o. female who  has a past medical history of Adult attention deficit disorder (06/02/2016), History of bipolar disorder (09/08/2016), and Tachycardia (07/27/2017).  [facility] today, 07/20/18,  for chief complaint of: Annual Physical Medication refills   Patient here for annual physical / wellness exam.  See preventive care reviewed as below.   Additional concerns today include:  Refill chronic medications  We discussed anxiety/depression given PHQ/GAD answers: see A/P. She has nursing school finals coming up!   [facility] 07/20/2018 01/21/2018 06/29/2017  Decreased Interest 1 1 1   Down, Depressed, Hopeless 1 1 1   PHQ - 2 Score 2 2 2   Altered sleeping 2 2 0  Tired, decreased energy 1 2 0  Change in appetite 2 1 0  Feeling bad or failure about yourself  1 1 0  Trouble concentrating 1 1 0  Moving slowly or fidgety/restless 1 1 0  Suicidal thoughts 0 0 0  PHQ-9 Score 10 10 2   Difficult doing work/chores Somewhat difficult Very difficult -   GAD 7 : Generalized Anxiety Score 07/20/2018 01/21/2018 12/02/2016  Nervous, Anxious, on Edge 2 1 3   Control/stop worrying 2 0 2  Worry too much - different things 2 1 2   Trouble relaxing 1 0 2  Restless 2 1 3   Easily annoyed or irritable 2 1 3   Afraid - awful might happen 1 1 3   Total GAD 7 Score 12 5 18   Anxiety Difficulty Somewhat difficult Somewhat difficult -     Past medical, surgical, social and family history reviewed:  Patient Active Problem List   Diagnosis Date Noted  . Tachycardia 07/27/2017  . Encounter for surveillance of injectable contraceptive 05/28/2017  . History of bipolar disorder 09/08/2016  . Breast lump in female 06/02/2016  . Adult attention deficit disorder 06/02/2016    No past surgical history on file.  Social History   Tobacco Use  . Smoking status: Never Smoker  . Smokeless tobacco: Never Used  Substance Use  Topics  . Alcohol use: Not on file    No family history on file.   Current medication list and allergy/intolerance information reviewed:    Current Outpatient Medications  Medication Sig Dispense Refill  . amphetamine-dextroamphetamine (ADDERALL) 15 MG tablet Take 1 tablet by mouth daily after lunch. 90 tablet 0  . buPROPion (WELLBUTRIN XL) 150 MG 24 hr tablet Take 1 tablet (150 mg total) by mouth daily. 90 tablet 1  . lamoTRIgine (LAMICTAL) 150 MG tablet TAKE 1 TABLET BY MOUTH DAILY. 90 tablet 0  . lisdexamfetamine (VYVANSE) 70 MG capsule Take 1 capsule (70 mg total) by mouth every morning. 30 capsule 0  . medroxyPROGESTERone (DEPO-PROVERA) 150 MG/ML injection Inject 1 mL (150 mg total) into the muscle every 3 (three) months. 1 mL 0   No current facility-administered medications for this visit.     Allergies  Allergen Reactions  . Penicillins       Review of Systems:  Constitutional:  No  fever, no chills, No recent illness, No unintentional weight changes. No significant fatigue.   HEENT: No  headache, no vision change, no hearing change, No sore throat, No  sinus pressure  Cardiac: No  chest pain, No  pressure, No palpitations, No  Orthopnea  Respiratory:  No  shortness of breath. No  Cough  Gastrointestinal: No  abdominal pain, No  nausea, No  vomiting,  No  blood in stool,  No  diarrhea, No  constipation   Musculoskeletal: No new myalgia/arthralgia  Skin: No  Rash, No other wounds/concerning lesions  Genitourinary: No  incontinence, No  abnormal genital bleeding, No abnormal genital discharge  Hem/Onc: No  easy bruising/bleeding, No  abnormal lymph node  Endocrine: No cold intolerance,  No heat intolerance. No polyuria/polydipsia/polyphagia   Neurologic: No  weakness, No  dizziness, No  slurred speech/focal weakness/facial droop  Psychiatric: No  concerns with depression, No  concerns with anxiety, No sleep problems, No mood problems  Exam:  BP 132/84   Pulse  71   Temp 98.1 F (36.7 C) (Oral)   Wt 178 lb (80.7 kg)   BMI 28.73 kg/m   Constitutional: VS see above. General Appearance: alert, well-developed, well-nourished, NAD  Eyes: Normal lids and conjunctive, non-icteric sclera  Ears, Nose, Mouth, Throat: MMM, Normal external inspection ears/nares/mouth/lips/gums. TM normal bilaterally. Pharynx/tonsils no erythema, no exudate. Nasal mucosa normal.   Neck: No masses, trachea midline. No thyroid enlargement. No tenderness/mass appreciated. No lymphadenopathy  Respiratory: Normal respiratory effort. no wheeze, no rhonchi, no rales  Cardiovascular: S1/S2 normal, no murmur, no rub/gallop auscultated. RRR. No lower extremity edema.   Gastrointestinal: Nontender, no masses. No hepatomegaly, no splenomegaly. No hernia appreciated. Bowel sounds normal. Rectal exam deferred.   Musculoskeletal: Gait normal. No clubbing/cyanosis of digits.   Neurological: Normal balance/coordination. No tremor. No cranial nerve deficit on limited exam. Motor and sensation intact and symmetric. Cerebellar reflexes intact.   Skin: warm, dry, intact. No rash/ulcer. No concerning nevi or subq nodules on limited exam.    Psychiatric: Normal judgment/insight. Normal mood and affect. Oriented x3.       ASSESSMENT/PLAN: The primary encounter diagnosis was Annual physical exam. Diagnoses of Attention deficit disorder, unspecified hyperactivity presence, Recurrent major depressive disorder, in full remission (HCC), and Adult attention deficit disorder were also pertinent to this visit.   Orders Placed This Encounter  Procedures  . CBC  . COMPLETE METABOLIC PANEL WITH GFR  . Lipid panel  . TSH   6 mos medicines   Meds ordered this encounter  Medications  . DISCONTD: lisdexamfetamine (VYVANSE) 70 MG capsule    Sig: Take 1 capsule (70 mg total) by mouth every morning.    Dispense:  30 capsule    Refill:  0    Cancel previous 90 days supply thanks  . DISCONTD:  amphetamine-dextroamphetamine (ADDERALL) 15 MG tablet    Sig: Take 1 tablet by mouth daily after lunch. #90 for ninety days    Dispense:  90 tablet    Refill:  0  . amphetamine-dextroamphetamine (ADDERALL) 15 MG tablet    Sig: Take 1 tablet by mouth daily after lunch. #90 for ninety days    Dispense:  90 tablet    Refill:  0  . DISCONTD: lisdexamfetamine (VYVANSE) 70 MG capsule    Sig: Take 1 capsule (70 mg total) by mouth every morning.    Dispense:  30 capsule    Refill:  0    Cancel previous 90 days supply thanks  . DISCONTD: lisdexamfetamine (VYVANSE) 70 MG capsule    Sig: Take 1 capsule (70 mg total) by mouth every morning.    Dispense:  30 capsule    Refill:  0    Cancel previous 90 days supply thanks  . DISCONTD: lisdexamfetamine (VYVANSE) 70 MG capsule    Sig: Take 1 capsule (70 mg total) by mouth every morning.    Dispense:  30 capsule    Refill:  0    Cancel previous 90 days supply thanks  . DISCONTD: lisdexamfetamine (VYVANSE) 70 MG capsule    Sig: Take 1 capsule (70 mg total) by mouth every morning.    Dispense:  30 capsule    Refill:  0    Cancel previous 90 days supply thanks  . lisdexamfetamine (VYVANSE) 70 MG capsule    Sig: Take 1 capsule (70 mg total) by mouth every morning.    Dispense:  30 capsule    Refill:  0    Cancel previous 90 days supply thanks    Patient Instructions  General Preventive Care  Most recent routine screening lipids/other labs: ordered today. Cholesterol and Diabetes screening usually recommended every few years for young, otherwise healthy people, Routine metabolic panel recommended for anyone on prescription medications.   Everyone should have blood pressure checked once per year.   Tobacco: don't! Alcohol: responsible moderation is ok for most adults - if you have concerns about your alcohol intake, please talk to me! Recreational/Illicit Drugs: don't!  Exercise: as tolerated to reduce risk of cardiovascular disease and diabetes.  Strength training will also prevent osteoporosis.   Mental health: if need for mental health care (medicines, counseling, other), or concerns about moods, please let me know!   Sexual health: if need for STD testing, or if concerns with libido/pain problems, please let me know! If you need to discuss your birth control options, please let me know!  Vaccines  Flu vaccine: thanks for getting flu vaccine this year!   Tetanus booster: Tdap recommended every 10 years - due 06/2026  HPV vaccine: we don't have records of this for you! It's an option for adults and most insurance will cover this vaccine.   Shingles vaccine: Shingrix recommended after age 84   Pneumonia vaccines: Prevnar and Pneumovax recommended after age 4 Cancer screenings   Colon cancer screening: recommended for everyone at age 65. Screening earlier depends on family history.   Breast cancer screening: mammogram recommended at age 67. Screening earlier depends on family history.   Cervical cancer screening: Pap due 06/2020  Lung cancer screening: not needed for non-smokers  Infection screenings . HIV: recommended screening routinely for sexually active people  . Gonorrhea/Chlamydia: recommended screening routinely for sexually active people, though many insurances will insist on testing for anyone on birth control  . Hepatitis C: recommended for anyone born 15-1966 - not needed for you!  . TB: certain at-risk populations, or depending on work requirements and/or travel history Other . Bone Density Test: recommended for women at age 72 . Advanced Directive: Living Will and/or Healthcare Power of Attorney recommended for all adults, regardless of age or health!    Immunization History  Administered Date(s) Administered  . Influenza,inj,Quad PF,6+ Mos 04/13/2018  . Influenza-Unspecified 06/17/2016  . Tdap 06/17/2016        Visit summary with medication list and pertinent instructions was printed for patient  to review. All questions at time of visit were answered - patient instructed to contact office with any additional concerns or updates. ER/RTC precautions were reviewed with the patient.    Please note: voice recognition software was used to produce this document, and typos may escape review. Please contact Dr. Lyn Hollingshead for any needed clarifications.     Follow-up plan: Return in about 6 months (around 01/19/2019) for ADD medication refills - sooner if needed and as scheduled for Depo shots .

## 2018-07-20 NOTE — Patient Instructions (Addendum)
General Preventive Care  Most recent routine screening lipids/other labs: ordered today. Cholesterol and Diabetes screening usually recommended every few years for young, otherwise healthy people, Routine metabolic panel recommended for anyone on prescription medications.   Everyone should have blood pressure checked once per year.   Tobacco: don't! Alcohol: responsible moderation is ok for most adults - if you have concerns about your alcohol intake, please talk to me! Recreational/Illicit Drugs: don't!  Exercise: as tolerated to reduce risk of cardiovascular disease and diabetes. Strength training will also prevent osteoporosis.   Mental health: if need for mental health care (medicines, counseling, other), or concerns about moods, please let me know!   Sexual health: if need for STD testing, or if concerns with libido/pain problems, please let me know! If you need to discuss your birth control options, please let me know!  Vaccines  Flu vaccine: thanks for getting flu vaccine this year!   Tetanus booster: Tdap recommended every 10 years - due 06/2026  HPV vaccine: we don't have records of this for you! It's an option for adults and most insurance will cover this vaccine.   Shingles vaccine: Shingrix recommended after age 28   Pneumonia vaccines: Prevnar and Pneumovax recommended after age 28 Cancer screenings   Colon cancer screening: recommended for everyone at age 28. Screening earlier depends on family history.   Breast cancer screening: mammogram recommended at age 28. Screening earlier depends on family history.   Cervical cancer screening: Pap due 06/2020  Lung cancer screening: not needed for non-smokers  Infection screenings . HIV: recommended screening routinely for sexually active people  . Gonorrhea/Chlamydia: recommended screening routinely for sexually active people, though many insurances will insist on testing for anyone on birth control  . Hepatitis C:  recommended for anyone born [id number] - not needed for you!  . TB: certain at-risk populations, or depending on work requirements and/or travel history Other . Bone Density Test: recommended for women at age 28 . Advanced Directive: Living Will and/or Healthcare Power of Attorney recommended for all adults, regardless of age or health!

## 2018-07-21 LAB — COMPLETE METABOLIC PANEL WITH GFR
AG Ratio: 1.7 (calc) (ref 1.0–2.5)
ALT: 79 U/L — ABNORMAL HIGH (ref 6–29)
AST: 55 U/L — ABNORMAL HIGH (ref 10–30)
Albumin: 4.5 g/dL (ref 3.6–5.1)
Alkaline phosphatase (APISO): 75 U/L (ref 33–115)
BUN: 12 mg/dL (ref 7–25)
CO2: 27 mmol/L (ref 20–32)
Calcium: 10.3 mg/dL — ABNORMAL HIGH (ref 8.6–10.2)
Chloride: 104 mmol/L (ref 98–110)
Creat: 0.72 mg/dL (ref 0.50–1.10)
GFR, Est African American: 132 mL/min/{1.73_m2} (ref >=60)
GFR, Est Non African American: 114 mL/min/{1.73_m2} (ref >=60)
GLUCOSE: 96 mg/dL (ref 65–99)
Globulin: 2.6 g/dL (calc) (ref 1.9–3.7)
Potassium: 4.4 mmol/L (ref 3.5–5.3)
Sodium: 137 mmol/L (ref 135–146)
Total Bilirubin: 0.7 mg/dL (ref 0.2–1.2)
Total Protein: 7.1 g/dL (ref 6.1–8.1)

## 2018-07-21 LAB — CBC
HCT: 44.5 % (ref 35.0–45.0)
Hemoglobin: 15.1 g/dL (ref 11.7–15.5)
MCH: 30.6 pg (ref 27.0–33.0)
MCHC: 33.9 g/dL (ref 32.0–36.0)
MCV: 90.3 fL (ref 80.0–100.0)
MPV: 10.4 fL (ref 7.5–12.5)
Platelets: 410 10*3/uL — ABNORMAL HIGH (ref 140–400)
RBC: 4.93 10*6/uL (ref 3.80–5.10)
RDW: 12 % (ref 11.0–15.0)
WBC: 7.8 10*3/uL (ref 3.8–10.8)

## 2018-07-21 LAB — LIPID PANEL
Cholesterol: 205 mg/dL — ABNORMAL HIGH (ref <200)
HDL: 53 mg/dL (ref >50)
LDL CHOLESTEROL (CALC): 127 mg/dL — AB
Non-HDL Cholesterol (Calc): 152 mg/dL (calc) — ABNORMAL HIGH (ref <130)
Total CHOL/HDL Ratio: 3.9 (calc) (ref <5.0)
Triglycerides: 139 mg/dL (ref <150)

## 2018-07-21 LAB — TSH: TSH: 1.08 mIU/L

## 2018-07-21 NOTE — Addendum Note (Signed)
Addended by: Deirdre PippinsALEXANDER, Averleigh Savary M on: 07/21/2018 08:05 AM   Modules accepted: Orders

## 2018-08-04 ENCOUNTER — Other Ambulatory Visit: Payer: Self-pay | Admitting: Osteopathic Medicine

## 2018-08-04 DIAGNOSIS — F339 Major depressive disorder, recurrent, unspecified: Secondary | ICD-10-CM

## 2018-08-04 MED FILL — buPROPion HCL ER (XL) 150 M: 150 | 90 days supply | Qty: 90 | Fill #0

## 2018-08-19 MED FILL — VYVANSE 70 MG CAPSULE: 70 | 30 days supply | Qty: 30 | Fill #0

## 2018-08-24 ENCOUNTER — Ambulatory Visit: Payer: Self-pay | Admitting: Osteopathic Medicine

## 2018-09-19 ENCOUNTER — Other Ambulatory Visit: Payer: Self-pay | Admitting: Osteopathic Medicine

## 2018-09-19 ENCOUNTER — Ambulatory Visit (INDEPENDENT_AMBULATORY_CARE_PROVIDER_SITE_OTHER): Payer: No Typology Code available for payment source | Admitting: Osteopathic Medicine

## 2018-09-19 VITALS — BP 127/74 | HR 70 | Temp 98.4°F | Wt 181.0 lb

## 2018-09-19 DIAGNOSIS — Z3042 Encounter for surveillance of injectable contraceptive: Secondary | ICD-10-CM

## 2018-09-19 DIAGNOSIS — Z8659 Personal history of other mental and behavioral disorders: Secondary | ICD-10-CM

## 2018-09-19 LAB — COMPLETE METABOLIC PANEL WITH GFR
AG Ratio: 1.7 (calc) (ref 1.0–2.5)
ALT: 27 U/L (ref 6–29)
AST: 19 U/L (ref 10–30)
Albumin: 4.3 g/dL (ref 3.6–5.1)
Alkaline phosphatase (APISO): 76 U/L (ref 31–125)
BUN: 11 mg/dL (ref 7–25)
CO2: 24 mmol/L (ref 20–32)
Calcium: 9.6 mg/dL (ref 8.6–10.2)
Chloride: 107 mmol/L (ref 98–110)
Creat: 0.69 mg/dL (ref 0.50–1.10)
GFR, Est African American: 137 mL/min/{1.73_m2} (ref >=60)
GFR, Est Non African American: 118 mL/min/{1.73_m2} (ref >=60)
Globulin: 2.6 g/dL (calc) (ref 1.9–3.7)
Glucose, Bld: 112 mg/dL — ABNORMAL HIGH (ref 65–99)
Potassium: 4.5 mmol/L (ref 3.5–5.3)
Sodium: 137 mmol/L (ref 135–146)
Total Bilirubin: 0.5 mg/dL (ref 0.2–1.2)
Total Protein: 6.9 g/dL (ref 6.1–8.1)

## 2018-09-19 MED ORDER — MEDROXYPROGESTERONE ACETATE 150 MG/ML IM SUSP
150.0000 mg | Freq: Once | INTRAMUSCULAR | Status: AC
  Administered 2018-09-19: 150 mg via INTRAMUSCULAR

## 2018-09-19 MED FILL — VYVANSE 70 MG CAPSULE: 70 | 30 days supply | Qty: 30 | Fill #0

## 2018-09-19 NOTE — Progress Notes (Signed)
Pt is here for a Depo Provera injection. Denies chest pain, shortness of breath, headaches, mood changes or problems with medication. Pt is within shot window for administration. Tolerated in LUOQ well, no immediate complications. Printed copy of next injection window provided. No further questions or concerns.

## 2018-09-19 NOTE — Telephone Encounter (Signed)
[facility] requesting med refills for lamotrigine. Labs results were abnormal. Pls advise, thanks.

## 2018-09-20 NOTE — Telephone Encounter (Signed)
Repeat labs were ok I sent refill

## 2018-09-20 NOTE — Telephone Encounter (Signed)
Left a brief vm msg for pt regarding med refill sent to pharmacy. Direct call back info provided. 

## 2018-09-24 IMAGING — CR DG WRIST COMPLETE 3+V*R*
4 series · 4 of 4 positions shown · non-contrast
Comparison: None.

CLINICAL DATA: Trauma injury to right wrist today; visible bruising
medially; pain increases with any rotation at the wrist/[place]

EXAM:
RIGHT WRIST - COMPLETE 3+ VIEW

[view not recorded (1 of 4)]
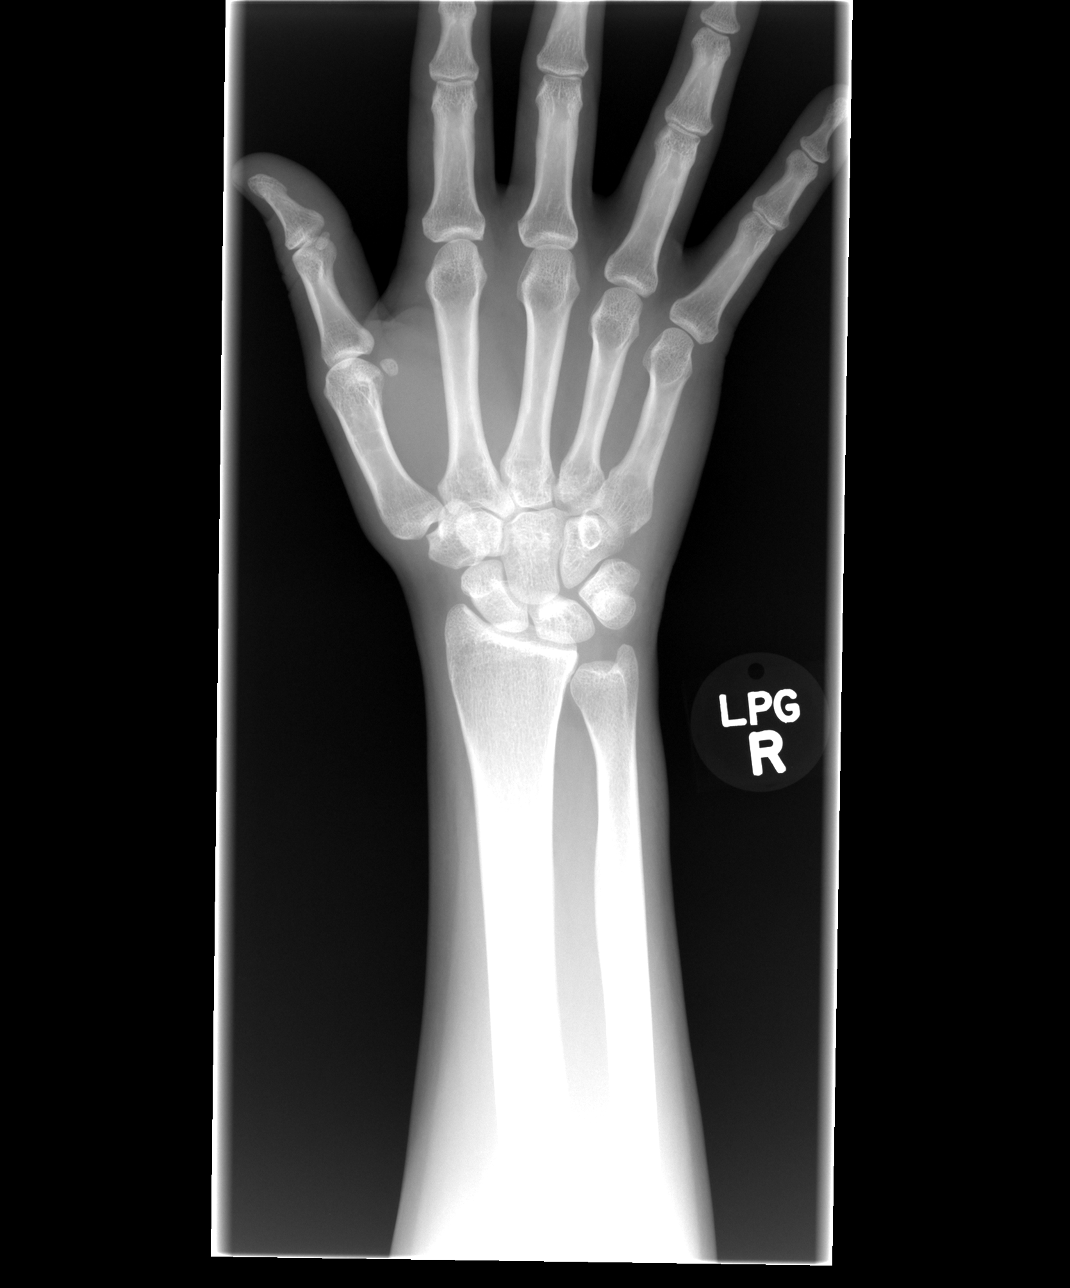

[view not recorded (2 of 4)]
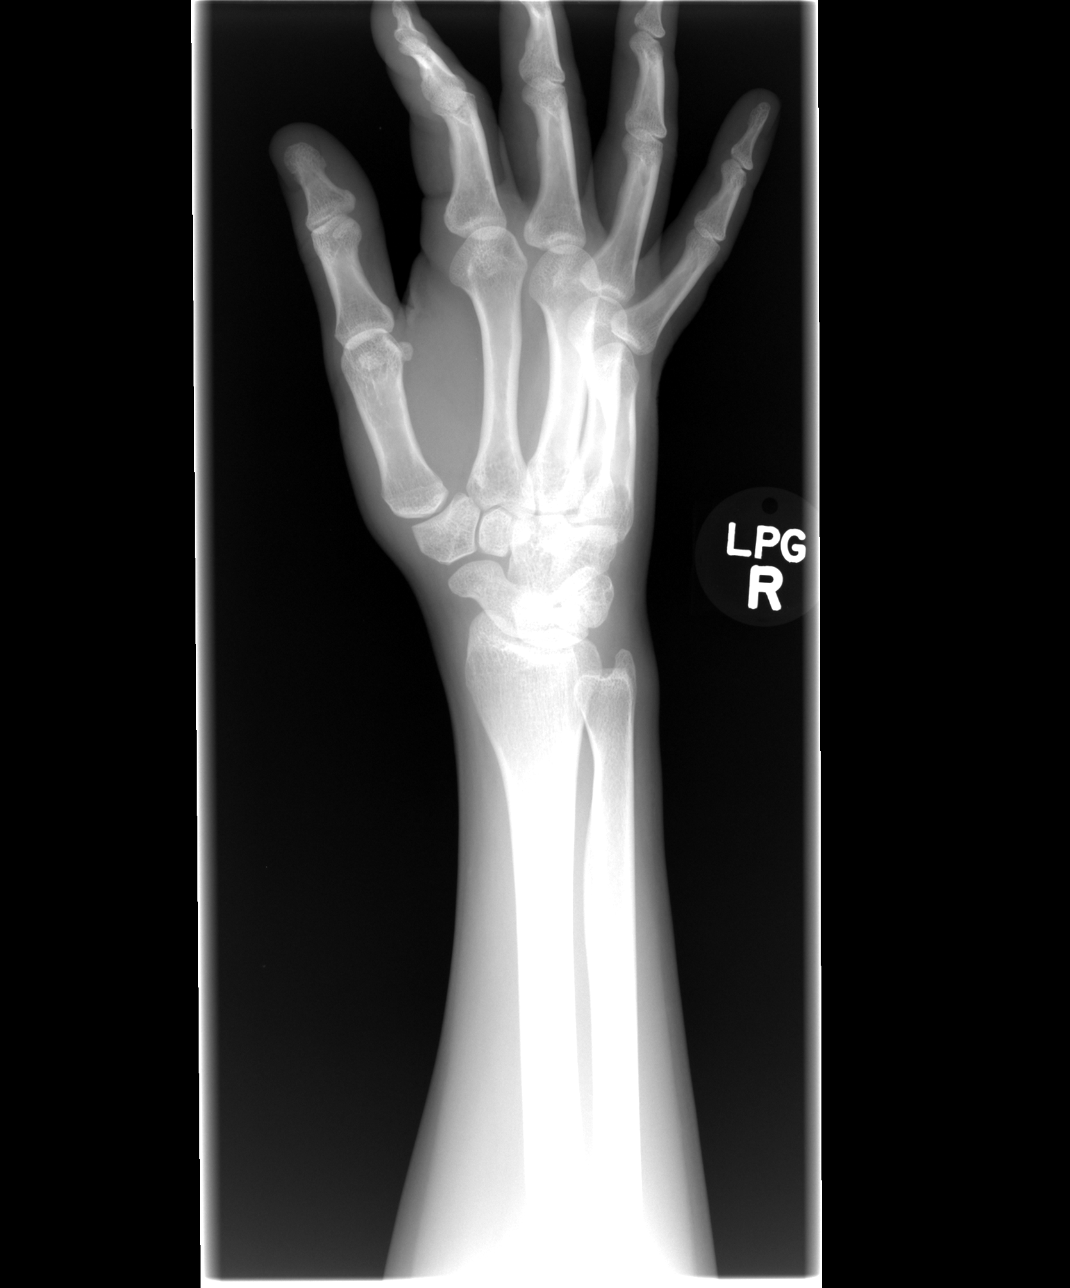

[view not recorded (3 of 4)]
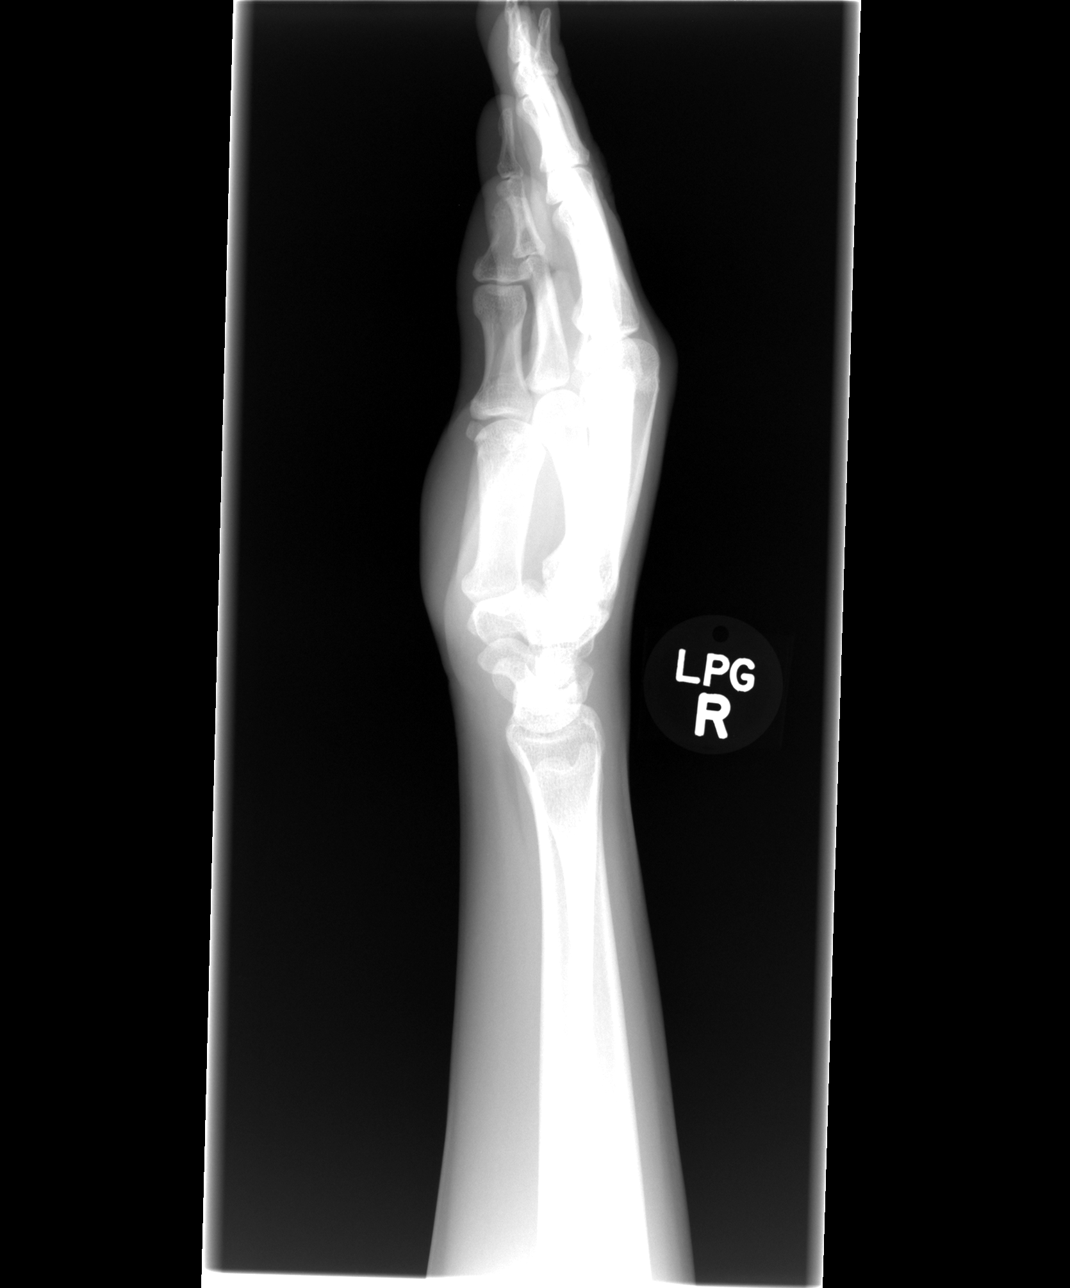

[view not recorded (4 of 4)]
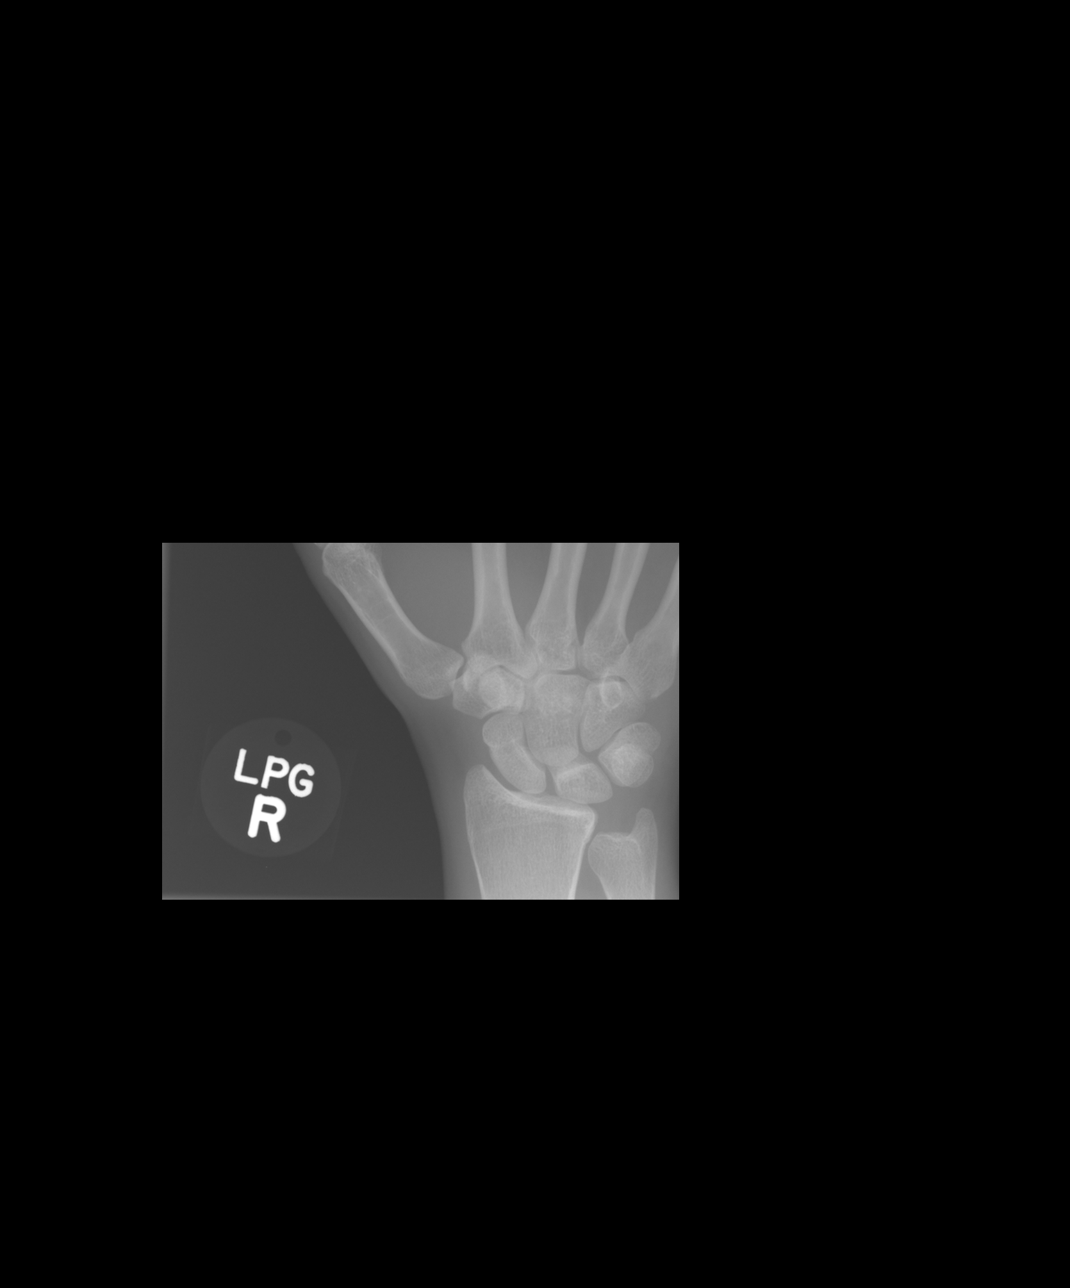

[4 of 4 positions shown; findings below may reference images not displayed]

FINDINGS: There is no evidence of fracture or dislocation. There is no
evidence of arthropathy or other focal bone abnormality. Soft
tissues are unremarkable.
IMPRESSION: Negative.

## 2018-10-18 ENCOUNTER — Other Ambulatory Visit: Payer: Self-pay | Admitting: Osteopathic Medicine

## 2018-10-18 DIAGNOSIS — F988 Other specified behavioral and emotional disorders with onset usually occurring in childhood and adolescence: Secondary | ICD-10-CM

## 2018-10-18 MED ORDER — LISDEXAMFETAMINE DIMESYLATE 70 MG PO CAPS: 70.0000 mg | ORAL_CAPSULE | ORAL | 0 refills | Status: DC

## 2018-10-18 MED ORDER — AMPHETAMINE-DEXTROAMPHETAMINE 15 MG PO TABS: 15.0000 mg | ORAL_TABLET | Freq: Every day | ORAL | 0 refills | Status: DC

## 2018-10-18 MED FILL — lamoTRIgine 150 MG TABS: 150 | 90 days supply | Qty: 90 | Fill #0

## 2018-10-18 MED FILL — DEXTROAMP-AMPHETAMIN 15 MG: 15 | 90 days supply | Qty: 90 | Fill #0

## 2018-10-18 MED FILL — VYVANSE 70 MG CAPSULE: 70 | 30 days supply | Qty: 30 | Fill #0

## 2018-10-18 NOTE — Telephone Encounter (Signed)
[facility] not do refills for this provider.  Adderall 15 mg refill request

## 2018-11-02 MED FILL — buPROPion HCL ER (XL) 150 M: 150 | 90 days supply | Qty: 90 | Fill #1

## 2018-11-16 ENCOUNTER — Encounter: Payer: Self-pay | Admitting: Osteopathic Medicine

## 2018-11-16 DIAGNOSIS — F988 Other specified behavioral and emotional disorders with onset usually occurring in childhood and adolescence: Secondary | ICD-10-CM

## 2018-11-16 MED ORDER — AMPHETAMINE-DEXTROAMPHETAMINE 15 MG PO TABS: 15.0000 mg | ORAL_TABLET | Freq: Every day | ORAL | 0 refills | Status: DC

## 2018-11-16 NOTE — Telephone Encounter (Signed)
Pt also wants to change pharmacy

## 2018-11-17 MED ORDER — LISDEXAMFETAMINE DIMESYLATE 70 MG PO CAPS: 70.0000 mg | ORAL_CAPSULE | ORAL | 0 refills | Status: DC

## 2018-11-17 NOTE — Addendum Note (Signed)
Addended by: Deirdre Pippins on: 11/17/2018 03:12 PM   Modules accepted: Orders

## 2018-11-21 ENCOUNTER — Encounter: Payer: Self-pay | Admitting: Osteopathic Medicine

## 2018-11-21 DIAGNOSIS — F988 Other specified behavioral and emotional disorders with onset usually occurring in childhood and adolescence: Secondary | ICD-10-CM

## 2018-11-21 MED ORDER — LISDEXAMFETAMINE DIMESYLATE 70 MG PO CAPS: 70.0000 mg | ORAL_CAPSULE | ORAL | 0 refills | Status: DC

## 2018-11-22 MED ORDER — LISDEXAMFETAMINE DIMESYLATE 70 MG PO CAPS: 70.0000 mg | ORAL_CAPSULE | ORAL | 0 refills | Status: DC

## 2018-11-22 MED FILL — VYVANSE 70 MG CAPSULE: 70 | 30 days supply | Qty: 30 | Fill #0

## 2018-11-22 NOTE — Addendum Note (Signed)
Addended by: Deirdre Pippins on: 11/22/2018 09:13 AM   Modules accepted: Orders

## 2018-12-06 ENCOUNTER — Ambulatory Visit (INDEPENDENT_AMBULATORY_CARE_PROVIDER_SITE_OTHER): Payer: No Typology Code available for payment source | Admitting: Osteopathic Medicine

## 2018-12-06 VITALS — BP 117/78 | HR 99

## 2018-12-06 DIAGNOSIS — Z3042 Encounter for surveillance of injectable contraceptive: Secondary | ICD-10-CM | POA: Diagnosis not present

## 2018-12-06 MED ORDER — MEDROXYPROGESTERONE ACETATE 150 MG/ML IM SUSP
150.0000 mg | Freq: Once | INTRAMUSCULAR | Status: AC
  Administered 2018-12-06: 150 mg via INTRAMUSCULAR

## 2018-12-06 NOTE — Progress Notes (Signed)
Established Patient Office Visit  Subjective:  Patient ID: Jenny Navarro, female    DOB: [birth year]  Age: 28 y.o. MRN: [id number]  CC:  Chief Complaint  Patient presents with  . Contraception    HPI Jenny Navarro is here for a Depo Provera injection. Denies chest pain, shortness of breath, headaches, mood changes or problems with medication. It has been < 14 weeks since her last Depo Provera injection.   Past Medical History:  Diagnosis Date  . Adult attention deficit disorder 06/02/2016   Awaiting records as of 06/02/2016  . History of bipolar disorder 09/08/2016  . Tachycardia 07/27/2017    No past surgical history on file.  No family history on file.  Social History   Socioeconomic History  . Marital status: Single    Spouse name: Not on file  . Number of children: Not on file  . Years of education: Not on file  . Highest education level: Not on file  Occupational History  . Not on file  Social Needs  . Financial resource strain: Not on file  . Food insecurity:    Worry: Not on file    Inability: Not on file  . Transportation needs:    Medical: Not on file    Non-medical: Not on file  Tobacco Use  . Smoking status: Never Smoker  . Smokeless tobacco: Never Used  Substance and Sexual Activity  . Alcohol use: Not on file  . Drug use: Not on file  . Sexual activity: Not on file  Lifestyle  . Physical activity:    Days per week: Not on file    Minutes per session: Not on file  . Stress: Not on file  Relationships  . Social connections:    Talks on phone: Not on file    Gets together: Not on file    Attends religious service: Not on file    Active member of club or organization: Not on file    Attends meetings of clubs or organizations: Not on file    Relationship status: Not on file  . Intimate partner violence:    Fear of current or ex partner: Not on file    Emotionally abused: Not on file    Physically abused: Not on file    Forced sexual activity: Not  on file  Other Topics Concern  . Not on file  Social History Narrative  . Not on file    Outpatient Medications Prior to Visit  Medication Sig Dispense Refill  . amphetamine-dextroamphetamine (ADDERALL) 15 MG tablet Take 1 tablet by mouth daily after lunch for 30 days. 30 tablet 0  . [START ON 12/19/2018] amphetamine-dextroamphetamine (ADDERALL) 15 MG tablet Take 1 tablet by mouth daily. 30 tablet 0  . [START ON 01/18/2019] amphetamine-dextroamphetamine (ADDERALL) 15 MG tablet Take 1 tablet by mouth daily. 30 tablet 0  . buPROPion (WELLBUTRIN XL) 150 MG 24 hr tablet TAKE 1 TABLET (150 MG TOTAL) BY MOUTH DAILY. 90 tablet 1  . lamoTRIgine (LAMICTAL) 150 MG tablet TAKE 1 TABLET BY MOUTH DAILY. 90 tablet 0  . lisdexamfetamine (VYVANSE) 70 MG capsule Take 1 capsule (70 mg total) by mouth every morning. 90 capsule 0  . medroxyPROGESTERone (DEPO-PROVERA) 150 MG/ML injection Inject 1 mL (150 mg total) into the muscle every 3 (three) months. 1 mL 0   No facility-administered medications prior to visit.     Allergies  Allergen Reactions  . Penicillins     ROS Review of Systems  Objective:    Physical Exam  BP 117/78   Pulse 99   SpO2 100%  Wt Readings from Last 3 Encounters:  09/19/18 181 lb (82.1 kg)  07/20/18 178 lb (80.7 kg)  04/21/18 169 lb 4.8 oz (76.8 kg)     There are no preventive care reminders to display for this patient.  There are no preventive care reminders to display for this patient.  Lab Results  Component Value Date   TSH 1.08 07/20/2018   Lab Results  Component Value Date   WBC 7.8 07/20/2018   HGB 15.1 07/20/2018   HCT 44.5 07/20/2018   MCV 90.3 07/20/2018   PLT 410 (H) 07/20/2018   Lab Results  Component Value Date   NA 137 09/19/2018   K 4.5 09/19/2018   CO2 24 09/19/2018   GLUCOSE 112 (H) 09/19/2018   BUN 11 09/19/2018   CREATININE 0.69 09/19/2018   BILITOT 0.5 09/19/2018   ALKPHOS 74 06/02/2016   AST 19 09/19/2018   ALT 27 09/19/2018    PROT 6.9 09/19/2018   ALBUMIN 4.1 06/02/2016   CALCIUM 9.6 09/19/2018   Lab Results  Component Value Date   CHOL 205 (H) 07/20/2018   Lab Results  Component Value Date   HDL 53 07/20/2018   Lab Results  Component Value Date   LDLCALC 127 (H) 07/20/2018   Lab Results  Component Value Date   TRIG 139 07/20/2018   Lab Results  Component Value Date   CHOLHDL 3.9 07/20/2018   No results found for: HGBA1C    Assessment & Plan:  Contraceptive - Patient tolerated injection well without complications. Patient advised to schedule next injection 12 weeks from today.   Problem List Items Addressed This Visit    Encounter for surveillance of injectable contraceptive - Primary   Relevant Medications   medroxyPROGESTERone (DEPO-PROVERA) injection 150 mg (Completed)      Meds ordered this encounter  Medications  . medroxyPROGESTERone (DEPO-PROVERA) injection 150 mg    Follow-up: Return in about 12 weeks (around 02/28/2019) for Depo Provera injection. Earna Coder.    Jarin Cornfield, Janalyn HarderAngela Hale, CMA

## 2018-12-21 ENCOUNTER — Other Ambulatory Visit: Payer: Self-pay | Admitting: Osteopathic Medicine

## 2018-12-21 ENCOUNTER — Encounter: Payer: Self-pay | Admitting: Osteopathic Medicine

## 2018-12-21 DIAGNOSIS — F988 Other specified behavioral and emotional disorders with onset usually occurring in childhood and adolescence: Secondary | ICD-10-CM

## 2018-12-21 MED FILL — VYVANSE 70 MG CAPSULE: 70 | 30 days supply | Qty: 30 | Fill #0

## 2018-12-21 NOTE — Telephone Encounter (Signed)
PCP out of office  Vyvanse pended  Last OV 12/06/18 and last RX sent in 11/22/18

## 2018-12-22 ENCOUNTER — Ambulatory Visit (INDEPENDENT_AMBULATORY_CARE_PROVIDER_SITE_OTHER): Payer: No Typology Code available for payment source | Admitting: Physician Assistant

## 2018-12-22 ENCOUNTER — Encounter: Payer: Self-pay | Admitting: Physician Assistant

## 2018-12-22 VITALS — BP 126/80 | HR 87 | Temp 98.1°F | Resp 16 | Wt 190.0 lb

## 2018-12-22 DIAGNOSIS — Z79899 Other long term (current) drug therapy: Secondary | ICD-10-CM

## 2018-12-22 DIAGNOSIS — Z8659 Personal history of other mental and behavioral disorders: Secondary | ICD-10-CM

## 2018-12-22 DIAGNOSIS — F339 Major depressive disorder, recurrent, unspecified: Secondary | ICD-10-CM

## 2018-12-22 DIAGNOSIS — Z6830 Body mass index (BMI) 30.0-30.9, adult: Secondary | ICD-10-CM | POA: Insufficient documentation

## 2018-12-22 DIAGNOSIS — F988 Other specified behavioral and emotional disorders with onset usually occurring in childhood and adolescence: Secondary | ICD-10-CM

## 2018-12-22 DIAGNOSIS — F419 Anxiety disorder, unspecified: Secondary | ICD-10-CM

## 2018-12-22 DIAGNOSIS — F32A Depression, unspecified: Secondary | ICD-10-CM

## 2018-12-22 DIAGNOSIS — E669 Obesity, unspecified: Secondary | ICD-10-CM

## 2018-12-22 DIAGNOSIS — F329 Major depressive disorder, single episode, unspecified: Secondary | ICD-10-CM

## 2018-12-22 DIAGNOSIS — R635 Abnormal weight gain: Secondary | ICD-10-CM

## 2018-12-22 MED ORDER — LAMOTRIGINE 150 MG PO TABS: 150.0000 mg | ORAL_TABLET | Freq: Every day | ORAL | 0 refills | Status: DC

## 2018-12-22 MED ORDER — BUPROPION HCL ER (XL) 150 MG PO TB24: 150.0000 mg | ORAL_TABLET | Freq: Every day | ORAL | 0 refills | Status: DC

## 2018-12-22 MED ORDER — LISDEXAMFETAMINE DIMESYLATE 70 MG PO CAPS: 70.0000 mg | ORAL_CAPSULE | ORAL | 0 refills | Status: DC

## 2018-12-22 MED ORDER — AMPHETAMINE-DEXTROAMPHETAMINE 15 MG PO TABS: 15.0000 mg | ORAL_TABLET | Freq: Every day | ORAL | 0 refills | Status: DC

## 2018-12-22 NOTE — Patient Instructions (Signed)
For weight loss: - download free trial of the [employer] app  - follow heart healthy diets like DASH or Mediterranean - keep meals balanced in terms of protein, fat and carbs (30/30/30) - Use MyFitnessPal app to log daily intake of food and drinks Make snacks high in protein (>10g) and low in carbs (<15g). Stay away from high sugar drinks and foods (sweet tea, juice, energy drinks, soda) Aim for 64 oz of water each day Aim to get 6-8 hours of sleep per night Avoid skipping meals Practice mindful eating You may substitute a protein drink for one meal per day

## 2018-12-22 NOTE — Progress Notes (Signed)
HPI:                                                                Jenny Navarro is a 28 y.o. female who presents to [facility]: Primary Care Sports Medicine today for medication management  Adult ADD: taking Vyvanse 70 mg and Adderall 15 mg Denies headache, chest pain, palpitations, irregular heart beat. Denies sleep disturbance or mood changes.  Depression/Anxiety: taking Wellbutrin XL 150 and Lamictal 150 mg without difficulty. 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 Denies symptoms of mania/hypomania. Denies suicidal thinking. Denies auditory/visual hallucinations.  Weight gain: endorses a 45 pound weight gain over the last 18 months. 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 She has recently started walking with her partner and reports she is unable to lose weight.  Depression screen [facility] 2/9 12/22/2018 07/20/2018 01/21/2018 06/29/2017 12/02/2016  Decreased Interest 1 1 1 1 3   Down, Depressed, Hopeless 1 1 1 1 3   PHQ - 2 Score 2 2 2 2 6   Altered sleeping 2 2 2  0 2  Tired, decreased energy 1 1 2  0 3  Change in appetite 3 2 1  0 2  Feeling bad or failure about yourself  1 1 1  0 3  Trouble concentrating 1 1 1  0 2  Moving slowly or fidgety/restless 0 1 1 0 2  Suicidal thoughts 0 0 0 0 1  PHQ-9 Score 10 10 10 2 21   Difficult doing work/chores Somewhat difficult Somewhat difficult Very difficult - -    GAD 7 : Generalized Anxiety Score 12/22/2018 07/20/2018 01/21/2018 12/02/2016  Nervous, Anxious, on Edge 1 2 1 3   Control/stop worrying 1 2 0 2  Worry too much - different things 1 2 1 2   Trouble relaxing 1 1 0 2  Restless 1 2 1 3   Easily annoyed or irritable 1 2 1 3   Afraid - awful might happen 2 1 1 3   Total GAD 7 Score 8 12 5 18   Anxiety Difficulty - Somewhat difficult Somewhat difficult -      Past Medical History:  Diagnosis Date  .  Adult attention deficit disorder 06/02/2016   Awaiting records as of 06/02/2016  . History of bipolar disorder 09/08/2016  . Tachycardia 07/27/2017   History reviewed. No pertinent surgical history. Social History   Tobacco Use  . Smoking status: Never Smoker  . Smokeless tobacco: Never Used  Substance Use Topics  . Alcohol use: Not on file   family history is not on file.    ROS: negative except as noted in the HPI  Medications: Current Outpatient Medications  Medication Sig Dispense Refill  . amphetamine-dextroamphetamine (ADDERALL) 15 MG tablet Take 1 tablet by mouth daily. 30 tablet 0  . [START ON 01/18/2019] amphetamine-dextroamphetamine (ADDERALL) 15 MG tablet Take 1 tablet by mouth daily. 30 tablet 0  . buPROPion (WELLBUTRIN XL) 150 MG 24 hr tablet Take 1 tablet (150 mg total) by mouth daily. 90 tablet 0  . lamoTRIgine (LAMICTAL) 150 MG tablet Take 1 tablet (150 mg total) by mouth daily. 90 tablet 0  . lisdexamfetamine (VYVANSE) 70 MG capsule Take 1 capsule (70 mg total) by mouth every  morning. 30 capsule 0  . medroxyPROGESTERone (DEPO-PROVERA) 150 MG/ML injection Inject 1 mL (150 mg total) into the muscle every 3 (three) months. 1 mL 0  . amphetamine-dextroamphetamine (ADDERALL) 15 MG tablet Take 1 tablet by mouth daily after lunch for 30 days. 30 tablet 0   No current facility-administered medications for this visit.    Allergies  Allergen Reactions  . Penicillins        Objective:  BP 126/80   Pulse 87   Temp 98.1 F (36.7 C) (Oral)   Resp 16   Wt 190 lb (86.2 kg)   BMI 30.67 kg/m  Gen:  alert, not ill-appearing, no distress, appropriate for age, obese female HEENT: head normocephalic without obvious abnormality, conjunctiva and cornea clear, trachea midline Pulm: Normal work of breathing, normal phonation Neuro: alert and oriented x 3, no tremor MSK: extremities atraumatic, normal gait and station Skin: intact, no rashes on exposed skin, no jaundice, no  cyanosis Psych: well-groomed, cooperative, good eye contact, mood "stressed," affect is appropriate, speech is articulate, and thought processes clear and goal-directed  Wt Readings from Last 3 Encounters:  12/22/18 190 lb (86.2 kg)  09/19/18 181 lb (82.1 kg)  07/20/18 178 lb (80.7 kg)       Assessment and Plan: 28 y.o. female with   .Jenny Navarro was seen today for medication management.  Diagnoses and all orders for this visit:  Adult attention deficit disorder -     amphetamine-dextroamphetamine (ADDERALL) 15 MG tablet; Take 1 tablet by mouth daily. -     lisdexamfetamine (VYVANSE) 70 MG capsule; Take 1 capsule (70 mg total) by mouth every morning.  Encounter for long-term (current) use of medications  Anxiety and depression  History of bipolar disorder -     lamoTRIgine (LAMICTAL) 150 MG tablet; Take 1 tablet (150 mg total) by mouth daily.  Depression, recurrent (HCC) -     buPROPion (WELLBUTRIN XL) 150 MG 24 hr tablet; Take 1 tablet (150 mg total) by mouth daily.  Abnormal weight gain   ADD, Mood disorder PHQ9=10, no acute safety issues, stable PDMP reviewed, no red flags Refills provided  BMI 30 Counseled on weight loss We discussed switching from Depo to LARC. She is interested in Jenny Navarro for amenorrhea benefit Recommended heart healthy, balanced calorie restricted diet Recommended trial of Noom for behavioral modification and coaching/support    Patient education and anticipatory guidance given Patient agrees with treatment plan Follow-up with PCP in 3 months for med mgmt or sooner as needed if symptoms worsen or fail to improve  Jenny Hubertharley E. Adarius Tigges PA-C

## 2019-01-13 MED FILL — buPROPion HCL ER (XL) 150 M: 150 | 90 days supply | Qty: 90 | Fill #0

## 2019-01-18 MED FILL — AMPHETAMINE-DEXTROAMPHETAMI: 15 | 30 days supply | Qty: 30 | Fill #0

## 2019-01-18 MED FILL — VYVANSE 70 MG CAPSULE: 70 | 30 days supply | Qty: 30 | Fill #0

## 2019-01-18 MED FILL — SUBVENITE 150 MG TABS: 150 | 90 days supply | Qty: 90 | Fill #0

## 2019-01-19 ENCOUNTER — Ambulatory Visit: Payer: Self-pay | Admitting: Osteopathic Medicine

## 2019-01-19 ENCOUNTER — Ambulatory Visit (INDEPENDENT_AMBULATORY_CARE_PROVIDER_SITE_OTHER): Payer: No Typology Code available for payment source | Admitting: Osteopathic Medicine

## 2019-01-19 ENCOUNTER — Encounter: Payer: Self-pay | Admitting: Osteopathic Medicine

## 2019-01-19 VITALS — BP 140/94 | HR 128 | Temp 98.6°F | Wt 193.8 lb

## 2019-01-19 DIAGNOSIS — Z30430 Encounter for insertion of intrauterine contraceptive device: Secondary | ICD-10-CM

## 2019-01-19 LAB — POCT URINE PREGNANCY: Preg Test, Ur: NEGATIVE

## 2019-01-19 MED ORDER — LEVONORGESTREL 19.5 MG IU IUD: 1.0000 | INTRAUTERINE_SYSTEM | Freq: Once | INTRAUTERINE | 0 refills | Status: DC

## 2019-01-19 NOTE — Progress Notes (Signed)
HPI: Jenny Navarro is a 28 y.o. female who presents to [facility] today 01/19/19 for chief complaint of: IUD placement   Here for IUD insertion.      Past medical, social and family history reviewed: Patient Active Problem List   Diagnosis Date Noted  . Abnormal weight gain 12/22/2018  . Class 1 obesity without serious comorbidity with body mass index (BMI) of 30.0 to 30.9 in adult 12/22/2018  . Recurrent major depressive disorder, in full remission (HCC) 07/20/2018  . Tachycardia 07/27/2017  . Encounter for surveillance of injectable contraceptive 05/28/2017  . History of bipolar disorder 09/08/2016  . Breast lump in female 06/02/2016  . Adult attention deficit disorder 06/02/2016   No past surgical history on file. Social History   Tobacco Use  Smoking Status Never Smoker  Smokeless Tobacco Never Used   No family history on file. Current Meds  Medication Sig  . amphetamine-dextroamphetamine (ADDERALL) 15 MG tablet Take 1 tablet by mouth daily.  Marland Kitchen amphetamine-dextroamphetamine (ADDERALL) 15 MG tablet Take 1 tablet by mouth daily.  Marland Kitchen buPROPion (WELLBUTRIN XL) 150 MG 24 hr tablet Take 1 tablet (150 mg total) by mouth daily.  Marland Kitchen lamoTRIgine (LAMICTAL) 150 MG tablet Take 1 tablet (150 mg total) by mouth daily.  Marland Kitchen lisdexamfetamine (VYVANSE) 70 MG capsule Take 1 capsule (70 mg total) by mouth every morning.  . [DISCONTINUED] medroxyPROGESTERone (DEPO-PROVERA) 150 MG/ML injection Inject 1 mL (150 mg total) into the muscle every 3 (three) months.   Allergies  Allergen Reactions  . Penicillins       Review of Systems: CONSTITUTIONAL:  No  fever, no chills HEAD/EYES/EARS/NOSE/THROAT: No  headache, no vision change, no history migraines CARDIAC: No  chest pain, No  pressure, No palpitations RESPIRATORY: No  cough, No  shortness of breath/wheeze GASTROINTESTINAL: No  nausea, No  vomiting, No  abdominal pain GENITOURINARY: No  incontinence, No   abnormal genital bleeding/discharge SKIN: No  rash/wounds/concerning lesions   Exam:  BP (!) 140/94 (BP Location: Left Arm, Patient Position: Sitting, Cuff Size: Normal)   Pulse (!) 128   Temp 98.6 F (37 C) (Oral)   Wt 193 lb 12.8 oz (87.9 kg)   BMI 31.28 kg/m  Constitutional: VS see above. General Appearance: alert, well-developed, well-nourished, NAD Psychiatric: Normal judgment/insight. Normal mood and affect. Oriented x3.  GYN: see procedure note  Results for orders placed or performed in visit on 01/19/19 (from the past 24 hour(s))  POCT urine pregnancy     Status: None   Collection Time: 01/19/19 11:05 AM  Result Value Ref Range   Preg Test, Ur Negative Negative         ASSESSMENT/PLAN: The encounter diagnosis was Encounter for IUD insertion.  Patient was counseled on the risks versus benefits of IUD contraception, including excellent contraceptive efficacy and often lighter periods, but risk of uterine perforation, small chance of ascending infection, irregular bleeding, and others. In light of her preferences, previous contraception experience, other risk factors as noted in history of present illness, patient opts to proceed with insertion of [name] IUD.   Patient was educated on the process for insertion, including what to expect from vaginal exam and insertion process, reasons that we would abort the procedure such as abnormal anatomy, non-passage of the uterine sound, patient inability to tolerate the procedure, or other.  All questions were answered. See consent form on file in scanned documents.   Patient Instructions  IUD AFTER-CARE INSTRUCTIONS: READ THOROUGHLY  Your [name]  IUD is currently approved to remain in place for 5 years. At that time, if you wish to receive a new IUD, this can be placed when your current one is removed. If you wish to remove your IUD at any time, for any reason, this can be done by your doctor.   You should feel for the strings to  your IUD routinely. If you cannot locate the strings, it is recommended you alert your doctor of this.   Be aware that in the first few weeks, your new IUD may cause some discomfort/cramping as it settles into place in your uterus. To ease discomfort, you may apply heating pad to abdomen and take Ibuprofen 800mg  by mouth every 6 hours as needed, but avoid using this dose continuously for more than 5 days. Walking helps as well. You can also expect some potentially heavy bleeding but it should not be heavy for more than a few days. Irregular bleeding and mild cramps can last for several weeks or month sin some people.   If pain is severe or if severe bleeding occurs, or if foul-smelling discharge or fever develops, or if you have any other concerns - contact your doctor right away or go to the Emergency Room.  IUD's are a very reliable method of birth control, but no method is 100% effective. If you think you may be pregnant, see your doctor right away.   An IUD will not protect you from sexually transmitted infections such as HIV, gonorrhea, chlamydia, HPV and others.   It is recommended that you see your doctor as directed for routine well-woman care, which includes Pap testing and may include screening for infections.        IUD PROCEDURE NOTE  PERTINENT RESULTS REVIEWED: PREGNANCY TEST PRIOR TO PROCEDURE: Negative GONORRHEA/CHLAMYDIA SCREEN: Not Available  PRIOR TO PROCEDURE: INFORMED CONSENT OBTAINED: yes SEE SCANNED DOCUMENTS ANY PRETREATMENT: no  PHYSICAL EXAM: GYN: No lesions/ulcers to external genitalia, normal urethra, normal vaginal mucosa, physiologic discharge, cervix normal without lesions, uterus not enlarged or tender, adnexa no masses and nontender  DESCRIPTION OF PROCEDURE: Vaginal speculum placed. Cervix and proximal vagina cleaned with Betadine.Tenaculum applied at 12:00 cervical position and gentle traction applied. Uterus sounded to 7 cm. IUD placed without  difficulty. IUD threads cut to 2-3cm from cervical os. Tenaculum and speculum removed. Patient felt strings. Patient tolerated procedure well. Sterile technique maintained.   IUD INFORMATION: BRAND: [name] Lot#: [id number] CARD GIVEN TO PATIENT: yes   All questions were answered. Visit summary with updated medication list and pertinent instructions was printed for patient. ER/RTC precautions were reviewed with the patient. Return for IUD string check 4 weeks as directed.

## 2019-01-19 NOTE — Patient Instructions (Signed)
IUD AFTER-CARE INSTRUCTIONS: READ THOROUGHLY  Your [name] IUD is currently approved to remain in place for 5 years. At that time, if you wish to receive a new IUD, this can be placed when your current one is removed. If you wish to remove your IUD at any time, for any reason, this can be done by your doctor.   You should feel for the strings to your IUD routinely. If you cannot locate the strings, it is recommended you alert your doctor of this.   Be aware that in the first few weeks, your new IUD may cause some discomfort/cramping as it settles into place in your uterus. To ease discomfort, you may apply heating pad to abdomen and take Ibuprofen 800mg  by mouth every 6 hours as needed, but avoid using this dose continuously for more than 5 days. Walking helps as well. You can also expect some potentially heavy bleeding but it should not be heavy for more than a few days. Irregular bleeding and mild cramps can last for several weeks or month sin some people.   If pain is severe or if severe bleeding occurs, or if foul-smelling discharge or fever develops, or if you have any other concerns - contact your doctor right away or go to the Emergency Room.  IUD's are a very reliable method of birth control, but no method is 100% effective. If you think you may be pregnant, see your doctor right away.   An IUD will not protect you from sexually transmitted infections such as HIV, gonorrhea, chlamydia, HPV and others.   It is recommended that you see your doctor as directed for routine well-woman care, which includes Pap testing and may include screening for infections.

## 2019-02-17 ENCOUNTER — Other Ambulatory Visit: Payer: Self-pay | Admitting: Osteopathic Medicine

## 2019-02-17 ENCOUNTER — Other Ambulatory Visit: Payer: Self-pay | Admitting: Physician Assistant

## 2019-02-17 DIAGNOSIS — F988 Other specified behavioral and emotional disorders with onset usually occurring in childhood and adolescence: Secondary | ICD-10-CM

## 2019-02-20 ENCOUNTER — Ambulatory Visit: Payer: No Typology Code available for payment source | Admitting: Osteopathic Medicine

## 2019-02-20 ENCOUNTER — Other Ambulatory Visit: Payer: Self-pay | Admitting: Physician Assistant

## 2019-02-20 DIAGNOSIS — F988 Other specified behavioral and emotional disorders with onset usually occurring in childhood and adolescence: Secondary | ICD-10-CM

## 2019-02-20 MED FILL — AMPHETAMINE-DEXTROAMPHETAMI: 15 | 30 days supply | Qty: 30 | Fill #0

## 2019-02-20 MED FILL — VYVANSE 70 MG CAPSULE: 70 | 30 days supply | Qty: 30 | Fill #0

## 2019-02-20 NOTE — Telephone Encounter (Signed)
[facility] requesting med refills for adderall and vyvanse.

## 2019-02-21 ENCOUNTER — Ambulatory Visit: Payer: No Typology Code available for payment source | Admitting: Osteopathic Medicine

## 2019-02-23 ENCOUNTER — Encounter: Payer: Self-pay | Admitting: Osteopathic Medicine

## 2019-02-27 ENCOUNTER — Ambulatory Visit (INDEPENDENT_AMBULATORY_CARE_PROVIDER_SITE_OTHER): Payer: No Typology Code available for payment source | Admitting: Osteopathic Medicine

## 2019-02-27 ENCOUNTER — Other Ambulatory Visit: Payer: Self-pay

## 2019-02-27 ENCOUNTER — Encounter: Payer: Self-pay | Admitting: Osteopathic Medicine

## 2019-02-27 VITALS — BP 126/81 | HR 116 | Temp 97.9°F | Wt 190.3 lb

## 2019-02-27 DIAGNOSIS — Z30431 Encounter for routine checking of intrauterine contraceptive device: Secondary | ICD-10-CM

## 2019-02-27 NOTE — Progress Notes (Signed)
CC: IUD string checkup S: [name] IUD inserted 01/19/19, since then no concerns, does not feel need to trim strings, has some mild cramping  O: GYN: No lesions/ulcers to external genitalia, normal urethra, normal vaginal mucosa, physiologic discharge, cervix normal without lesions, IUD strings noted at cervical os and device not visible  A/P: IUD monitoring. IUD strings noted appropriately in place, did not need to be trimmed

## 2019-03-07 ENCOUNTER — Ambulatory Visit: Payer: Self-pay

## 2019-03-22 ENCOUNTER — Other Ambulatory Visit: Payer: Self-pay | Admitting: Osteopathic Medicine

## 2019-03-22 DIAGNOSIS — F988 Other specified behavioral and emotional disorders with onset usually occurring in childhood and adolescence: Secondary | ICD-10-CM

## 2019-03-22 MED ORDER — LISDEXAMFETAMINE DIMESYLATE 70 MG PO CAPS: 70.0000 mg | ORAL_CAPSULE | Freq: Every day | ORAL | 0 refills | Status: DC

## 2019-03-22 MED ORDER — AMPHETAMINE-DEXTROAMPHETAMINE 15 MG PO TABS: 15.0000 mg | ORAL_TABLET | Freq: Every day | ORAL | 0 refills | Status: DC

## 2019-03-22 MED FILL — AMPHETAMINE-DEXTROAMPHETAMI: 15 | 30 days supply | Qty: 30 | Fill #0

## 2019-03-22 MED FILL — VYVANSE 70 MG CAPSULE: 70 | 30 days supply | Qty: 30 | Fill #0

## 2019-03-22 NOTE — Telephone Encounter (Signed)
[facility] requesting med refills for vyvanse and adderall.

## 2019-03-24 ENCOUNTER — Ambulatory Visit: Payer: No Typology Code available for payment source | Admitting: Osteopathic Medicine

## 2019-04-20 ENCOUNTER — Other Ambulatory Visit: Payer: Self-pay | Admitting: Osteopathic Medicine

## 2019-04-20 ENCOUNTER — Other Ambulatory Visit: Payer: Self-pay | Admitting: Physician Assistant

## 2019-04-20 ENCOUNTER — Encounter: Payer: Self-pay | Admitting: Osteopathic Medicine

## 2019-04-20 DIAGNOSIS — F988 Other specified behavioral and emotional disorders with onset usually occurring in childhood and adolescence: Secondary | ICD-10-CM

## 2019-04-20 DIAGNOSIS — F339 Major depressive disorder, recurrent, unspecified: Secondary | ICD-10-CM

## 2019-04-20 DIAGNOSIS — Z8659 Personal history of other mental and behavioral disorders: Secondary | ICD-10-CM

## 2019-04-20 MED ORDER — LAMOTRIGINE 150 MG PO TABS: 150.0000 mg | ORAL_TABLET | Freq: Every day | ORAL | 0 refills | Status: DC

## 2019-04-20 MED ORDER — BUPROPION HCL ER (XL) 150 MG PO TB24: 150.0000 mg | ORAL_TABLET | Freq: Every day | ORAL | 0 refills | Status: DC

## 2019-04-20 MED FILL — SUBVENITE 150 MG TABS: 150 | 90 days supply | Qty: 90 | Fill #0

## 2019-04-20 MED FILL — buPROPion HCL ER (XL) 150 M: 150 | 90 days supply | Qty: 90 | Fill #0

## 2019-04-20 NOTE — Telephone Encounter (Signed)
[place] requesting med refill for vyvanse and adderall.

## 2019-04-21 MED ORDER — AMPHETAMINE-DEXTROAMPHETAMINE 15 MG PO TABS: 15.0000 mg | ORAL_TABLET | Freq: Every day | ORAL | 0 refills | Status: DC

## 2019-04-21 MED ORDER — LISDEXAMFETAMINE DIMESYLATE 70 MG PO CAPS: 70.0000 mg | ORAL_CAPSULE | Freq: Every day | ORAL | 0 refills | Status: DC

## 2019-04-21 MED FILL — AMPHETAMINE-DEXTROAMPHETAMI: 15 | 30 days supply | Qty: 30 | Fill #0

## 2019-04-21 MED FILL — VYVANSE 70 MG CAPSULE: 70 | 30 days supply | Qty: 30 | Fill #0

## 2019-05-22 ENCOUNTER — Encounter: Payer: Self-pay | Admitting: Osteopathic Medicine

## 2019-05-22 ENCOUNTER — Other Ambulatory Visit: Payer: Self-pay | Admitting: Osteopathic Medicine

## 2019-05-22 DIAGNOSIS — F988 Other specified behavioral and emotional disorders with onset usually occurring in childhood and adolescence: Secondary | ICD-10-CM

## 2019-05-22 MED ORDER — LISDEXAMFETAMINE DIMESYLATE 70 MG PO CAPS: 70.0000 mg | ORAL_CAPSULE | Freq: Every day | ORAL | 0 refills | Status: DC

## 2019-05-22 MED ORDER — AMPHETAMINE-DEXTROAMPHETAMINE 15 MG PO TABS: 15.0000 mg | ORAL_TABLET | Freq: Every day | ORAL | 0 refills | Status: DC

## 2019-05-22 MED FILL — VYVANSE 70 MG CAPSULE: 70 | 30 days supply | Qty: 30 | Fill #0

## 2019-05-22 MED FILL — AMPHETAMINE-DEXTROAMPHETAMI: 15 | 30 days supply | Qty: 30 | Fill #0

## 2019-05-22 NOTE — Telephone Encounter (Signed)
Requested medication (s) are due for refill today: yes  Requested medication (s) are on the active medication list: yes  Last refill: 04/21/2019  Future visit scheduled: no  Notes to clinic:  Refill cannot be delegated   Requested Prescriptions  Pending Prescriptions Disp Refills   amphetamine-dextroamphetamine (ADDERALL) 15 MG tablet [Pharmacy Med Name: AMPHETAMINE-DEXTROAMPHETAMI 15 Tablet] 30 tablet 0    Sig: Take 1 tablet by mouth daily.     Not Delegated - Psychiatry:  Stimulants/ADHD Failed - 05/22/2019  2:09 PM      Failed - This refill cannot be delegated      Failed - Urine Drug Screen completed in last 360 days.      Failed - Valid encounter within last 3 months    Recent Outpatient Visits          2 months ago IUD check up   [place], Lanelle Bal, DO   4 months ago Encounter for IUD insertion   [place], Lanelle Bal, DO   5 months ago Adult attention deficit disorder   [place], Elson Areas, PA-C   5 months ago Encounter for surveillance of injectable contraceptive   [facility], Lanelle Bal, DO   8 months ago Encounter for surveillance of injectable contraceptive   [facility], Natalie, DO              VYVANSE 70 MG capsule [Pharmacy Med Name: VYVANSE 70 MG CAPSULE 70 Capsule] 30 capsule 0    Sig: TAKE 1 CAPSULE (70 MG TOTAL) BY MOUTH DAILY.     Not Delegated - Psychiatry:  Stimulants/ADHD Failed - 05/22/2019  2:09 PM      Failed - This refill cannot be delegated      Failed - Urine Drug Screen completed in last 360 days.      Failed - Valid encounter within last 3 months    Recent Outpatient Visits          2 months ago IUD check up   [place], Lanelle Bal, DO   4 months ago  Encounter for IUD insertion   [place], Lanelle Bal, DO   5 months ago Adult attention deficit disorder   [facility], Elson Areas, PA-C   5 months ago Encounter for surveillance of injectable contraceptive   [facility], Lanelle Bal, DO   8 months ago Encounter for surveillance of injectable contraceptive   [facility], [place], DO

## 2019-05-22 NOTE — Telephone Encounter (Signed)
Appt scheduled for 05/30/19

## 2019-05-30 ENCOUNTER — Ambulatory Visit (INDEPENDENT_AMBULATORY_CARE_PROVIDER_SITE_OTHER): Payer: No Typology Code available for payment source | Admitting: Osteopathic Medicine

## 2019-05-30 ENCOUNTER — Encounter: Payer: Self-pay | Admitting: Osteopathic Medicine

## 2019-05-30 VITALS — BP 117/79 | HR 85 | Temp 98.1°F | Wt 190.4 lb

## 2019-05-30 DIAGNOSIS — F419 Anxiety disorder, unspecified: Secondary | ICD-10-CM | POA: Diagnosis not present

## 2019-05-30 DIAGNOSIS — F32A Depression, unspecified: Secondary | ICD-10-CM

## 2019-05-30 DIAGNOSIS — F329 Major depressive disorder, single episode, unspecified: Secondary | ICD-10-CM

## 2019-05-30 DIAGNOSIS — Z8659 Personal history of other mental and behavioral disorders: Secondary | ICD-10-CM | POA: Diagnosis not present

## 2019-05-30 DIAGNOSIS — F988 Other specified behavioral and emotional disorders with onset usually occurring in childhood and adolescence: Secondary | ICD-10-CM | POA: Diagnosis not present

## 2019-05-30 MED ORDER — AMPHETAMINE-DEXTROAMPHET ER 30 MG PO CP24: 30.0000 mg | ORAL_CAPSULE | ORAL | 0 refills | Status: DC

## 2019-05-30 MED FILL — ADDERALL XR 30 MG CAP SA: 30 | 30 days supply | Qty: 30 | Fill #0

## 2019-05-30 NOTE — Progress Notes (Signed)
HPI: Jenny Navarro is a 28 y.o. female who  has a past medical history of Adult attention deficit disorder (06/02/2016), History of bipolar disorder (09/08/2016), and Tachycardia (07/27/2017).  she presents to [facility] today, 05/30/19,  for chief complaint of:  ADHD follow-up   Adult ADD: taking Vyvanse 70 mg in AM, and Adderall IR 15 mg around lunch. Denies headache, chest pain, palpitations, irregular heart beat. Denies sleep disturbance or mood changes.  Thinks that the Vyvanse might be causing some dizziness and would like to discuss possibly switching medication.  Depression/Anxiety: taking Wellbutrin XL 150 mg and Lamictal 150 mg without difficulty.  No concerns with mood at this point  Weight gain: endorses a 45 pound weight gain over the last 2 years. 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  We switched from Depo to IUD.  She has been better about exercising  BP Readings from Last 3 Encounters:  05/30/19 117/79  02/27/19 126/81  01/19/19 (!) 140/94   Wt Readings from Last 3 Encounters:  05/30/19 190 lb 6.4 oz (86.4 kg)  02/27/19 190 lb 4.8 oz (86.3 kg)  01/19/19 193 lb 12.8 oz (87.9 kg)         At today's visit 05/30/19 ... PMH, PSH, FH reviewed and updated as needed.  Current medication list and allergy/intolerance hx reviewed and updated as needed. (See remainder of HPI, ROS, Phys Exam below)   No results found.  No results found for this or any previous visit (from the past 72 hour(s)).        ASSESSMENT/PLAN: The primary encounter diagnosis was Adult attention deficit disorder. Diagnoses of Anxiety and depression and History of bipolar disorder were also pertinent to this visit.   Has only been on Vyvanse long-acting with Adderall short acting in addition as needed.  Will trial switching Vyvanse to long-acting Adderall, would hold off on the short acting for a  couple of days until she knows how the long-acting affects her, might not need the IR   No orders of the defined types were placed in this encounter.    Meds ordered this encounter  Medications  . amphetamine-dextroamphetamine (ADDERALL XR) 30 MG 24 hr capsule    Sig: Take 1 capsule (30 mg total) by mouth every morning.    Dispense:  30 capsule    Refill:  0    There are no Patient Instructions on file for this visit.    Follow-up plan: Return in about 4 weeks (around 06/27/2019) for RECHECK ON NEW MEDICATION (ADDERALL, STOPPED VYVANSE) - virtual.                                                 ################################################# ################################################# ################################################# #################################################    No outpatient medications have been marked as taking for the 05/30/19 encounter (Appointment) with Emeterio Reeve, DO.    Allergies  Allergen Reactions  . Penicillins        Review of Systems:  Constitutional: No recent illness  HEENT: No  headache, no vision change  Cardiac: No  chest pain, No  pressure, No palpitations  Respiratory:  No  shortness of breath. No  Cough  Neurologic: No  weakness, +Dizziness  Psychiatric: No  concerns with depression, No  concerns with anxiety  Exam:  BP 117/79 (BP Location:  Left Arm, Patient Position: Sitting, Cuff Size: Normal)   Pulse 85   Temp 98.1 F (36.7 C) (Oral)   Wt 190 lb 6.4 oz (86.4 kg)   BMI 30.73 kg/m   Constitutional: VS see above. General Appearance: alert, well-developed, well-nourished, NAD  Respiratory: Normal respiratory effort. no wheeze, no rhonchi, no rales  Cardiovascular: S1/S2 normal, no murmur, no rub/gallop auscultated. RRR.   Musculoskeletal: Gait normal. Symmetric and independent movement of all extremities  Neurological: Normal  balance/coordination. No tremor.  Skin: warm, dry, intact.   Psychiatric: Normal judgment/insight. Normal mood and affect. Oriented x3.       Visit summary with medication list and pertinent instructions was printed for patient to review, patient was advised to alert [place] if any updates are needed. All questions at time of visit were answered - patient instructed to contact office with any additional concerns. ER/RTC precautions were reviewed with the patient and understanding verbalized.    Please note: voice recognition software was used to produce this document, and typos may escape review. Please contact Dr. Lyn Hollingshead for any needed clarifications.    Follow up plan: Return in about 4 weeks (around 06/27/2019) for RECHECK ON NEW MEDICATION (ADDERALL, STOPPED VYVANSE) - virtual.

## 2019-06-16 ENCOUNTER — Encounter: Payer: Self-pay | Admitting: Osteopathic Medicine

## 2019-06-16 DIAGNOSIS — Z111 Encounter for screening for respiratory tuberculosis: Secondary | ICD-10-CM

## 2019-06-16 NOTE — Telephone Encounter (Signed)
Lab pended.

## 2019-06-18 LAB — QUANTIFERON-TB GOLD PLUS
Mitogen-NIL: >10 IU/mL
NIL: 0.02 IU/mL
QuantiFERON-TB Gold Plus: NEGATIVE
TB1-NIL: 0 IU/mL
TB2-NIL: <0 IU/mL

## 2019-06-20 ENCOUNTER — Other Ambulatory Visit: Payer: Self-pay | Admitting: Osteopathic Medicine

## 2019-06-21 MED ORDER — AMPHETAMINE-DEXTROAMPHETAMINE 15 MG PO TABS: 15.0000 mg | ORAL_TABLET | Freq: Every day | ORAL | 0 refills | Status: DC

## 2019-06-21 MED FILL — AMPHETAMINE-DEXTROAMPHETAMI: 15 | 30 days supply | Qty: 30 | Fill #0

## 2019-06-27 ENCOUNTER — Encounter: Payer: Self-pay | Admitting: Osteopathic Medicine

## 2019-06-27 ENCOUNTER — Ambulatory Visit (INDEPENDENT_AMBULATORY_CARE_PROVIDER_SITE_OTHER): Payer: No Typology Code available for payment source | Admitting: Osteopathic Medicine

## 2019-06-27 ENCOUNTER — Other Ambulatory Visit: Payer: Self-pay | Admitting: Osteopathic Medicine

## 2019-06-27 VITALS — BP 117/86 | HR 93 | Temp 97.9°F | Wt 186.0 lb

## 2019-06-27 DIAGNOSIS — F909 Attention-deficit hyperactivity disorder, unspecified type: Secondary | ICD-10-CM

## 2019-06-27 MED ORDER — LISDEXAMFETAMINE DIMESYLATE 60 MG PO CAPS: 60.0000 mg | ORAL_CAPSULE | ORAL | 0 refills | Status: DC

## 2019-06-27 MED FILL — VYVANSE 60 MG CAPSULE: 60 | 15 days supply | Qty: 15 | Fill #0

## 2019-06-27 NOTE — Progress Notes (Signed)
Virtual Visit via Video (App used: Doximity) Note  I connected with      Jenny Navarro on 06/27/19 at 10:30 by a telemedicine application and verified that I am speaking with the correct person using two identifiers.  Patient is at home I am in office   I discussed the limitations of evaluation and management by telemedicine and the availability of in person appointments. The patient expressed understanding and agreed to proceed.  History of Present Illness: Jenny Navarro is a 28 y.o. female who would like to discuss  ADHD follow-up  Patient was previously on Vyvanse up to 70 mg, with addition of immediate release Adderall 15 mg around lunchtime.  We had transition from Vyvanse to long-acting, extended release Adderall 30 mg in the morning, keeping the 15 mg immediate release in the afternoon.  Patient had some concerns that Vyvanse may be contributing to some vague dizziness type feelings that she was having and wanted to switch medication.  Patient states that point, she would like to maybe try getting back on the Vyvanse at a lower dose since the Adderall is her feel a bit irritable      Observations/Objective: BP 117/86 (Patient Position: Sitting, Cuff Size: Normal)   Pulse 93   Temp 97.9 F (36.6 C) (Oral)   Wt 186 lb (84.4 kg)   BMI 30.02 kg/m  BP Readings from Last 3 Encounters:  06/27/19 117/86  05/30/19 117/79  02/27/19 126/81   Exam: Normal Speech.  NAD  Lab and Radiology Results No results found for this or any previous visit (from the past 72 hour(s)). No results found.     Assessment and Plan: 28 y.o. female with The encounter diagnosis was Attention deficit hyperactivity disorder (ADHD), unspecified ADHD type.   We will try getting back on the Vyvanse, will reduce from 70 mg to 60 mg and see if this helps.  Would consider genetic testing for drug metabolism variations, psychiatry referral, if we are not able to get patient feeling better.  May  consider switching to Ritalin or alternative stimulant.  Patient states that she would like to probably get off of the medications altogether next year, she is thinking of getting pregnant.   PDMP not reviewed this encounter. No orders of the defined types were placed in this encounter.  Meds ordered this encounter  Medications  . lisdexamfetamine (VYVANSE) 60 MG capsule    Sig: Take 1 capsule (60 mg total) by mouth every morning.    Dispense:  15 capsule    Refill:  0   There are no Patient Instructions on file for this visit.  Instructions sent via MyChart. If MyChart not available, pt was given option for info via personal e-mail w/ no guarantee of protected health info over unsecured e-mail communication, and MyChart sign-up instructions were sent to patient.   Follow Up Instructions: Return for Recheck depending on how patient is feeling on the Vyvanse.  MyChart reminder sent to patient .  She was instructed to message me when she received this reminder!   I discussed the assessment and treatment plan with the patient. The patient was provided an opportunity to ask questions and all were answered. The patient agreed with the plan and demonstrated an understanding of the instructions.   The patient was advised to call back or seek an in-person evaluation if any new concerns, if symptoms worsen or if the condition fails to improve as anticipated.  25 minutes of non-face-to-face time was provided  during this encounter.      . . . . . . . . . . . . . Marland Kitchen                   Historical information moved to improve visibility of documentation.  Past Medical History:  Diagnosis Date  . Adult attention deficit disorder 06/02/2016   Awaiting records as of 06/02/2016  . History of bipolar disorder 09/08/2016  . Tachycardia 07/27/2017   No past surgical history on file. Social History   Tobacco Use  . Smoking status: Never Smoker  . Smokeless tobacco:  Never Used  Substance Use Topics  . Alcohol use: Not on file   family history is not on file.  Medications: Current Outpatient Medications  Medication Sig Dispense Refill  . amphetamine-dextroamphetamine (ADDERALL) 15 MG tablet Take 1 tablet by mouth daily. 30 tablet 0  . buPROPion (WELLBUTRIN XL) 150 MG 24 hr tablet Take 1 tablet (150 mg total) by mouth daily. 90 tablet 0  . lamoTRIgine (LAMICTAL) 150 MG tablet Take 1 tablet (150 mg total) by mouth daily. 90 tablet 0  . Levonorgestrel ([name]) 19.5 MG IUD 1 each (19.5 mg total) by Intrauterine route once for 1 dose. 1 each 0  . lisdexamfetamine (VYVANSE) 60 MG capsule Take 1 capsule (60 mg total) by mouth every morning. 15 capsule 0   No current facility-administered medications for this visit.    Allergies  Allergen Reactions  . Penicillins

## 2019-06-27 NOTE — Telephone Encounter (Signed)
Jenny Navarro requesting med refill for adderall xr 30 mg. Pt has a virtual visit scheduled today.

## 2019-06-27 NOTE — Telephone Encounter (Signed)
Patient came in the office.

## 2019-07-10 MED ORDER — LISDEXAMFETAMINE DIMESYLATE 60 MG PO CAPS: 60.0000 mg | ORAL_CAPSULE | ORAL | 0 refills | Status: DC

## 2019-07-10 MED FILL — VYVANSE 60 MG CAPSULE: 60 | 30 days supply | Qty: 30 | Fill #0

## 2019-07-10 NOTE — Telephone Encounter (Signed)
RX pended 

## 2019-07-20 ENCOUNTER — Other Ambulatory Visit: Payer: Self-pay | Admitting: Osteopathic Medicine

## 2019-07-20 ENCOUNTER — Other Ambulatory Visit: Payer: Self-pay

## 2019-07-20 ENCOUNTER — Encounter: Payer: Self-pay | Admitting: Osteopathic Medicine

## 2019-07-20 DIAGNOSIS — F339 Major depressive disorder, recurrent, unspecified: Secondary | ICD-10-CM

## 2019-07-20 DIAGNOSIS — Z8659 Personal history of other mental and behavioral disorders: Secondary | ICD-10-CM

## 2019-07-20 DIAGNOSIS — F988 Other specified behavioral and emotional disorders with onset usually occurring in childhood and adolescence: Secondary | ICD-10-CM

## 2019-07-20 MED ORDER — LAMOTRIGINE 150 MG PO TABS: 150.0000 mg | ORAL_TABLET | Freq: Every day | ORAL | 0 refills | Status: DC

## 2019-07-20 MED ORDER — AMPHETAMINE-DEXTROAMPHETAMINE 15 MG PO TABS: 15.0000 mg | ORAL_TABLET | Freq: Every day | ORAL | 0 refills | Status: DC

## 2019-07-20 MED ORDER — BUPROPION HCL ER (XL) 150 MG PO TB24: 150.0000 mg | ORAL_TABLET | Freq: Every day | ORAL | 0 refills | Status: DC

## 2019-07-20 MED FILL — buPROPion HCL ER (XL) 150 M: 150 | 30 days supply | Qty: 30 | Fill #0

## 2019-07-20 MED FILL — SUBVENITE 150 MG TABS: 150 | 90 days supply | Qty: 90 | Fill #0

## 2019-07-20 MED FILL — AMPHETAMINE-DEXTROAMPHETAMI: 15 | 90 days supply | Qty: 90 | Fill #0

## 2019-07-20 NOTE — Telephone Encounter (Signed)
90 day pended  Last RX was sent 01/18/19  Last visit was 06/27/19

## 2019-08-09 MED FILL — VYVANSE 60 MG CAPSULE: 60 | 30 days supply | Qty: 30 | Fill #0

## 2019-09-08 MED FILL — VYVANSE 60 MG CAPSULE: 60 | 30 days supply | Qty: 30 | Fill #0

## 2019-09-18 ENCOUNTER — Encounter: Payer: Self-pay | Admitting: Osteopathic Medicine

## 2019-09-19 ENCOUNTER — Encounter: Payer: No Typology Code available for payment source | Admitting: Nurse Practitioner

## 2019-09-19 NOTE — Telephone Encounter (Signed)
Appointment has been made. No further questions at this time.  

## 2019-09-21 ENCOUNTER — Telehealth (INDEPENDENT_AMBULATORY_CARE_PROVIDER_SITE_OTHER): Payer: No Typology Code available for payment source | Admitting: Osteopathic Medicine

## 2019-09-21 ENCOUNTER — Encounter: Payer: Self-pay | Admitting: Osteopathic Medicine

## 2019-09-21 DIAGNOSIS — F988 Other specified behavioral and emotional disorders with onset usually occurring in childhood and adolescence: Secondary | ICD-10-CM | POA: Diagnosis not present

## 2019-09-21 MED ORDER — AMPHETAMINE-DEXTROAMPHETAMINE 20 MG PO TABS: 20.0000 mg | ORAL_TABLET | Freq: Every day | ORAL | 0 refills | Status: DC

## 2019-09-21 MED ORDER — LISDEXAMFETAMINE DIMESYLATE 60 MG PO CAPS: 60.0000 mg | ORAL_CAPSULE | ORAL | 0 refills | Status: DC

## 2019-09-21 MED FILL — AMPHETAMINE-DEXTROAMPHETAMI: 20 | 30 days supply | Qty: 45 | Fill #0

## 2019-09-21 NOTE — Progress Notes (Signed)
Virtual Visit via Video (App used: MyChart) Note  I connected with      Jenny Navarro on 09/21/19 at 7:18 AM  by a telemedicine application and verified that I am speaking with the correct person using two identifiers.  Patient is at home I am in office   I discussed the limitations of evaluation and management by telemedicine and the availability of in person appointments. The patient expressed understanding and agreed to proceed.  History of Present Illness: Jenny Navarro is a 29 y.o. female who would like to discuss ADHD medications.  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  She is finding that overall the Vyvanse is working pretty well for her but wearing off around 3 PM, she has been taking the immediate release Adderall 15 mg but this has been wearing off in a couple of hours, would like to try going up on the dose.  We had done Vyvanse 70 mg in the past but this caused some jitteriness, she would like to stay at the 60 mg of the Vyvanse.    Had [place] vaccine 08/16/2019 and 09/07/2019 -reported fatigue like immune reaction with the first vaccine, has had headaches on and off since the second vaccine but nothing seriously bothering her      Observations/Objective: There were no vitals taken for this visit. BP Readings from Last 3 Encounters:  06/27/19 117/86  05/30/19 117/79  02/27/19 126/81   Exam: Normal Speech.  NAD  Lab and Radiology Results No results found for this or any previous visit (from the past 72 hour(s)). No results found.     Assessment and Plan: 29 y.o. female with The encounter diagnosis was Adult attention deficit disorder.  Okay to leave Vyvanse as is, will increase p.m. dose of immediate release Adderall as below.  PDMP reviewed during this encounter. No orders of the defined types were placed in this encounter.  Meds ordered this encounter  Medications   . lisdexamfetamine (VYVANSE) 60 MG capsule    Sig: Take 1 capsule (60 mg total) by mouth every morning. Can reduce to 30 days supply if needed / if this is what's required by manufacturer coupon    Dispense:  90 capsule    Refill:  0  . amphetamine-dextroamphetamine (ADDERALL) 20 MG tablet    Sig: Take 1-1.5 tablets (20-30 mg total) by mouth daily. In afternoons as needed for attention deficit    Dispense:  45 tablet    Refill:  0     Follow Up Instructions: Return for Check based on response to medication, patient will send [place] a MyChart message in a couple weeks.    I discussed the assessment and treatment plan with the patient. The patient was provided an opportunity to ask questions and all were answered. The patient agreed with the plan and demonstrated an understanding of the instructions.   The patient was advised to call back or seek an in-person evaluation if any new concerns, if symptoms worsen or if the condition fails to improve as anticipated.  30 minutes of non-face-to-face time was provided during this encounter.      . . . . . . . . . . . . . Marland Kitchen                   Historical information moved to improve visibility of documentation.  Past Medical History:  Diagnosis Date  .  Adult attention deficit disorder 06/02/2016   Awaiting records as of 06/02/2016  . History of bipolar disorder 09/08/2016  . Tachycardia 07/27/2017   No past surgical history on file. Social History   Tobacco Use  . Smoking status: Never Smoker  . Smokeless tobacco: Never Used  Substance Use Topics  . Alcohol use: Not on file   family history is not on file.  Medications: Current Outpatient Medications  Medication Sig Dispense Refill  . amphetamine-dextroamphetamine (ADDERALL) 15 MG tablet Take 1 tablet by mouth daily. 90 tablet 0  . buPROPion (WELLBUTRIN XL) 150 MG 24 hr tablet Take 1 tablet (150 mg total) by mouth daily. 90 tablet 0  . lamoTRIgine  (LAMICTAL) 150 MG tablet Take 1 tablet (150 mg total) by mouth daily. 90 tablet 0  . Levonorgestrel ([name]) 19.5 MG IUD 1 each (19.5 mg total) by Intrauterine route once for 1 dose. 1 each 0  . lisdexamfetamine (VYVANSE) 60 MG capsule Take 1 capsule (60 mg total) by mouth every morning. 30 capsule 0   No current facility-administered medications for this visit.   Allergies  Allergen Reactions  . Penicillins

## 2019-10-09 MED FILL — VYVANSE 60 MG CAPSULE: 60 | 30 days supply | Qty: 30 | Fill #0

## 2019-10-19 ENCOUNTER — Encounter: Payer: Self-pay | Admitting: Osteopathic Medicine

## 2019-10-19 ENCOUNTER — Other Ambulatory Visit: Payer: Self-pay | Admitting: Osteopathic Medicine

## 2019-10-19 DIAGNOSIS — F339 Major depressive disorder, recurrent, unspecified: Secondary | ICD-10-CM

## 2019-10-19 DIAGNOSIS — Z8659 Personal history of other mental and behavioral disorders: Secondary | ICD-10-CM

## 2019-10-20 MED ORDER — LAMOTRIGINE 150 MG PO TABS: 150.0000 mg | ORAL_TABLET | Freq: Every day | ORAL | 1 refills | Status: DC

## 2019-10-20 MED ORDER — BUPROPION HCL ER (XL) 150 MG PO TB24: 150.0000 mg | ORAL_TABLET | Freq: Every day | ORAL | 1 refills | Status: DC

## 2019-10-20 MED FILL — SUBVENITE 150 MG TABS: 150 | 90 days supply | Qty: 90 | Fill #0

## 2019-10-20 MED FILL — buPROPion HCL ER (XL) 150 M: 150 | 90 days supply | Qty: 90 | Fill #0

## 2019-10-23 ENCOUNTER — Other Ambulatory Visit: Payer: Self-pay | Admitting: Osteopathic Medicine

## 2019-10-23 MED ORDER — AMPHETAMINE-DEXTROAMPHETAMINE 20 MG PO TABS: 20.0000 mg | ORAL_TABLET | Freq: Every day | ORAL | 0 refills | Status: DC

## 2019-10-23 MED FILL — AMPHETAMINE-DEXTROAMPHETAMI: 20 | 30 days supply | Qty: 45 | Fill #0

## 2019-10-23 NOTE — Telephone Encounter (Signed)
[facility] requesting med refill for amphetamine-dextro. Forwarding to covering provider.

## 2019-11-06 ENCOUNTER — Ambulatory Visit (INDEPENDENT_AMBULATORY_CARE_PROVIDER_SITE_OTHER): Payer: No Typology Code available for payment source | Admitting: Osteopathic Medicine

## 2019-11-06 ENCOUNTER — Encounter: Payer: Self-pay | Admitting: Osteopathic Medicine

## 2019-11-06 ENCOUNTER — Other Ambulatory Visit: Payer: Self-pay

## 2019-11-06 ENCOUNTER — Telehealth: Payer: Self-pay | Admitting: Osteopathic Medicine

## 2019-11-06 VITALS — BP 135/90 | HR 92 | Ht 66.0 in | Wt 189.0 lb

## 2019-11-06 DIAGNOSIS — F988 Other specified behavioral and emotional disorders with onset usually occurring in childhood and adolescence: Secondary | ICD-10-CM

## 2019-11-06 DIAGNOSIS — Z0000 Encounter for general adult medical examination without abnormal findings: Secondary | ICD-10-CM | POA: Diagnosis not present

## 2019-11-06 DIAGNOSIS — F3342 Major depressive disorder, recurrent, in full remission: Secondary | ICD-10-CM | POA: Diagnosis not present

## 2019-11-06 DIAGNOSIS — Z8659 Personal history of other mental and behavioral disorders: Secondary | ICD-10-CM

## 2019-11-06 MED ORDER — LISDEXAMFETAMINE DIMESYLATE 60 MG PO CAPS: 60.0000 mg | ORAL_CAPSULE | ORAL | 0 refills | Status: DC

## 2019-11-06 MED ORDER — AMPHETAMINE-DEXTROAMPHETAMINE 20 MG PO TABS: 20.0000 mg | ORAL_TABLET | Freq: Every day | ORAL | 0 refills | Status: DC

## 2019-11-06 MED FILL — VYVANSE 60 MG CAPSULE: 60 | 90 days supply | Qty: 90 | Fill #0

## 2019-11-06 NOTE — Telephone Encounter (Signed)
Thanks, will assume error w/ PDMP

## 2019-11-06 NOTE — Telephone Encounter (Signed)
Patient said pharmacy only filled #30 of her ADHD meds Vyvanse, can we confirm this w/ pharmacy - PDMP says they filled #90 last month? Thanks!

## 2019-11-06 NOTE — Progress Notes (Signed)
Jenny Navarro is a 29 y.o. female who presents to  [place] at [facility]  today, 11/06/19, seeking care for the following: . Annual physical   Physical  Constitutional:  . VSS, see nurse notes . General Appearance: alert, well-developed, well-nourished, NAD Eyes: Marland Kitchen Normal lids and conjunctive, non-icteric sclera . PERRLA Ears, Nose, Mouth, Throat: . Normal appearance . Normal external auditory canal and TM bilaterally . MMM, posterior pharynx without erythema/exudate Neck: . No masses, trachea midline . No thyroid enlargement/tenderness/mass appreciated Respiratory: . Normal respiratory effort . No dullness/hyper-resonance to percussion . Breath sounds normal, no wheeze/rhonchi/rales Cardiovascular: . S1/S2 normal, no murmur/rub/gallop auscultated . No carotid bruit or JVD . No lower extremity edema Gastrointestinal: . Nontender, no masses . No hepatomegaly, no splenomegaly . No hernia appreciated Musculoskeletal:  . Gait normal . No clubbing/cyanosis of digits Neurological: . No cranial nerve deficit on limited exam . Motor and sensation intact and symmetric Psychiatric: . Normal judgment/insight . Normal mood and affect        ASSESSMENT & PLAN with other pertinent history/findings:  The primary encounter diagnosis was Annual physical exam. Diagnoses of Adult attention deficit disorder, History of bipolar disorder, and Recurrent major depressive disorder, in full remission (Jenny Navarro) were also pertinent to this visit.    Patient Instructions  General Preventive Care  Most recent routine screening lipids/other labs: ordered!   Everyone should have blood pressure checked once per year.   Tobacco: don't!   Alcohol: responsible moderation is ok for most adults - if you have concerns about your alcohol intake, please talk to me!   Exercise: as tolerated to reduce risk of cardiovascular disease and diabetes. Strength  training will also prevent osteoporosis.   Mental health: if need for mental health care (medicines, counseling, other), or concerns about moods, please let me know!   Sexual health: if need for STD testing, or if concerns with libido/pain problems, please let me know! If you need to discuss your birth control options, please let me know!   Advanced Directive: Living Will and/or Healthcare Power of Attorney recommended for all adults, regardless of age or health.  Vaccines  Flu vaccine: recommended for almost everyone, every fall.   Shingles vaccine: age 40  Pneumonia vaccines: age 4  Tetanus booster: every 10 years. Due 2027.   HPV vaccine: up to age 87 to prevent HPV-associated diseases, including certain cancers.   COVID: all done! Phebe Colla!  Cancer screenings   Colon cancer screening: recommended for everyone age 41-75  Breast cancer screening: mammogram recommended age 61-75  Cervical cancer screening: Pap due 06/2020  Lung cancer screening: not needed for non-smokers  Infection screenings . HIV: recommended screening at least once age 39-65, more often as needed. . Gonorrhea/Chlamydia: screening as needed . Hepatitis C: recommended for anyone born 77-1966 . TB: certain at-risk populations, or depending on work requirements and/or travel history Other  Bone Density Test: recommended for women at age 72    Orders Placed This Encounter  Procedures  . CBC  . COMPLETE METABOLIC PANEL WITH GFR  . Lipid panel    No orders of the defined types were placed in this encounter.      Follow-up instructions: Return in about 6 months (around 05/08/2020) for maintain ADD Rx, can get PAP at that visit as well! Marland Kitchen  BP 135/90   Pulse 92   Ht 5\' 6"  (1.676 m)   Wt 189 lb (85.7 kg)   BMI 30.51 kg/m   Current Meds  Medication Sig  . amphetamine-dextroamphetamine (ADDERALL) 20 MG tablet Take 1-1.5  tablets (20-30 mg total) by mouth daily. In afternoons as needed for attention deficit  . buPROPion (WELLBUTRIN XL) 150 MG 24 hr tablet Take 1 tablet (150 mg total) by mouth daily.  lamoTRIgine (LAMICTAL) 150 MG tablet Take 1 tablet (150 mg total) by mouth daily.  Marland Kitchen lisdexamfetamine (VYVANSE) 60 MG capsule Take 1 capsule (60 mg total) by mouth every morning. Can reduce to 30 days supply if needed / if this is what's required by manufacturer coupon    No results found for this or any previous visit (from the past 72 hour(s)).  No results found.  Depression screen [facility] 2/9 11/06/2019 06/27/2019 12/22/2018  Decreased Interest 1 1 1   Down, Depressed, Hopeless 1 1 1   PHQ - 2 Score 2 2 2   Altered sleeping 1 1 2   Tired, decreased energy 1 2 1   Change in appetite 0 0 3  Feeling bad or failure about yourself  1 1 1   Trouble concentrating 1 1 1   Moving slowly or fidgety/restless 0 0 0  Suicidal thoughts 0 0 0  PHQ-9 Score 6 7 10   Difficult doing work/chores Somewhat difficult Somewhat difficult Somewhat difficult    GAD 7 : Generalized Anxiety Score 11/06/2019 06/27/2019 12/22/2018 07/20/2018  Nervous, Anxious, on Edge 1 1 1 2   Control/stop worrying 1 1 1 2   Worry too much - different things 1 1 1 2   Trouble relaxing 1 1 1 1   Restless 1 0 1 2  Easily annoyed or irritable 1 2 1 2   Afraid - awful might happen 1 1 2 1   Total GAD 7 Score 7 7 8 12   Anxiety Difficulty Somewhat difficult Somewhat difficult - Somewhat difficult    Immunization History  Administered Date(s) Administered  . Influenza,inj,Quad PF,6+ Mos 04/13/2018, 04/17/2019  . Influenza-Unspecified 06/17/2016, 04/17/2019  . Tdap 06/17/2016     All questions at time of visit were answered - patient instructed to contact office with any additional concerns or updates.  ER/RTC precautions were reviewed with the patient.  Please note: voice recognition software was used to produce this document, and typos may escape review. Please  contact Dr. 11/08/2019 for any needed clarifications.

## 2019-11-06 NOTE — Patient Instructions (Addendum)
General Preventive Care  Most recent routine screening lipids/other labs: ordered!   Everyone should have blood pressure checked once per year.   Tobacco: don't!   Alcohol: responsible moderation is ok for most adults - if you have concerns about your alcohol intake, please talk to me!   Exercise: as tolerated to reduce risk of cardiovascular disease and diabetes. Strength training will also prevent osteoporosis.   Mental health: if need for mental health care (medicines, counseling, other), or concerns about moods, please let me know!   Sexual health: if need for STD testing, or if concerns with libido/pain problems, please let me know! If you need to discuss your birth control options, please let me know!   Advanced Directive: Living Will and/or Healthcare Power of Attorney recommended for all adults, regardless of age or health.  Vaccines  Flu vaccine: recommended for almost everyone, every fall.   Shingles vaccine: age 52  Pneumonia vaccines: age 59  Tetanus booster: every 10 years. Due 2027.   HPV vaccine: up to age 54 to prevent HPV-associated diseases, including certain cancers.   COVID: all done! Renee Rival!  Cancer screenings   Colon cancer screening: recommended for everyone age 30-75  Breast cancer screening: mammogram recommended age 54-75  Cervical cancer screening: Pap due 06/2020  Lung cancer screening: not needed for non-smokers  Infection screenings . HIV: recommended screening at least once age 13-65, more often as needed. . Gonorrhea/Chlamydia: screening as needed . Hepatitis C: recommended for anyone born 13-1966 . TB: certain at-risk populations, or depending on work requirements and/or travel history Other  Bone Density Test: recommended for women at age 68

## 2019-11-06 NOTE — Telephone Encounter (Signed)
Pharmacy confirmed she only received 30 capsules on her last fill.

## 2019-11-08 ENCOUNTER — Encounter: Payer: Self-pay | Admitting: Osteopathic Medicine

## 2019-11-08 ENCOUNTER — Telehealth: Payer: Self-pay

## 2019-11-08 NOTE — Telephone Encounter (Signed)
FYI - Pt sent a MyChart msg requesting to have A1c added to recent labs. As per pt, she was fasting at time of blood draw. Additional lab added for confirmatory of pre-diabetes/diabetes. Pt aware clinic will contact her with results once available. Requisition number #: [id number].

## 2019-11-10 LAB — COMPLETE METABOLIC PANEL WITH GFR
AG Ratio: 1.6 (calc) (ref 1.0–2.5)
ALT: 31 U/L — ABNORMAL HIGH (ref 6–29)
AST: 22 U/L (ref 10–30)
Albumin: 4.1 g/dL (ref 3.6–5.1)
Alkaline phosphatase (APISO): 85 U/L (ref 31–125)
BUN: 8 mg/dL (ref 7–25)
CO2: 25 mmol/L (ref 20–32)
Calcium: 9.7 mg/dL (ref 8.6–10.2)
Chloride: 106 mmol/L (ref 98–110)
Creat: 0.67 mg/dL (ref 0.50–1.10)
GFR, Est African American: 138 mL/min/{1.73_m2} (ref >=60)
GFR, Est Non African American: 119 mL/min/{1.73_m2} (ref >=60)
Globulin: 2.6 g/dL (calc) (ref 1.9–3.7)
Glucose, Bld: 104 mg/dL — ABNORMAL HIGH (ref 65–99)
Potassium: 4.4 mmol/L (ref 3.5–5.3)
Sodium: 139 mmol/L (ref 135–146)
Total Bilirubin: 0.4 mg/dL (ref 0.2–1.2)
Total Protein: 6.7 g/dL (ref 6.1–8.1)

## 2019-11-10 LAB — LIPID PANEL
Cholesterol: 144 mg/dL (ref <200)
HDL: 49 mg/dL — ABNORMAL LOW (ref >=50)
LDL Cholesterol (Calc): 80 mg/dL (calc)
Non-HDL Cholesterol (Calc): 95 mg/dL (calc) (ref <130)
Total CHOL/HDL Ratio: 2.9 (calc) (ref <5.0)
Triglycerides: 70 mg/dL (ref <150)

## 2019-11-10 LAB — CBC
HCT: 43.9 % (ref 35.0–45.0)
Hemoglobin: 14.5 g/dL (ref 11.7–15.5)
MCH: 29.7 pg (ref 27.0–33.0)
MCHC: 33 g/dL (ref 32.0–36.0)
MCV: 89.8 fL (ref 80.0–100.0)
MPV: 11.2 fL (ref 7.5–12.5)
Platelets: 401 10*3/uL — ABNORMAL HIGH (ref 140–400)
RBC: 4.89 10*6/uL (ref 3.80–5.10)
RDW: 12.2 % (ref 11.0–15.0)
WBC: 7.2 10*3/uL (ref 3.8–10.8)

## 2019-11-10 LAB — HEMOGLOBIN A1C W/OUT EAG: Hgb A1c MFr Bld: 5.1 % of total Hgb (ref <5.7)

## 2019-11-20 MED FILL — AMPHETAMINE-DEXTROAMPHETAMI: 20 | 60 days supply | Qty: 90 | Fill #0

## 2019-12-12 ENCOUNTER — Encounter: Payer: Self-pay | Admitting: Osteopathic Medicine

## 2020-01-18 ENCOUNTER — Other Ambulatory Visit: Payer: Self-pay | Admitting: Osteopathic Medicine

## 2020-01-19 ENCOUNTER — Other Ambulatory Visit: Payer: Self-pay | Admitting: Osteopathic Medicine

## 2020-01-19 MED ORDER — AMPHETAMINE-DEXTROAMPHETAMINE 20 MG PO TABS: 20.0000 mg | ORAL_TABLET | Freq: Every day | ORAL | 0 refills | Status: DC

## 2020-01-19 MED FILL — AMPHETAMINE-DEXTROAMPHETAMI: 20 | 60 days supply | Qty: 90 | Fill #0

## 2020-01-19 MED FILL — buPROPion HCL ER (XL) 150 M: 150 | 90 days supply | Qty: 90 | Fill #1

## 2020-01-19 MED FILL — SUBVENITE 150 MG TABS: 150 | 90 days supply | Qty: 90 | Fill #1

## 2020-02-02 ENCOUNTER — Other Ambulatory Visit: Payer: Self-pay | Admitting: Osteopathic Medicine

## 2020-02-05 MED ORDER — LISDEXAMFETAMINE DIMESYLATE 60 MG PO CAPS: 60.0000 mg | ORAL_CAPSULE | ORAL | 0 refills | Status: DC

## 2020-02-05 MED FILL — VYVANSE 60 MG CAPSULE: 60 | 90 days supply | Qty: 90 | Fill #0

## 2020-03-19 ENCOUNTER — Other Ambulatory Visit: Payer: Self-pay | Admitting: Osteopathic Medicine

## 2020-03-19 MED ORDER — AMPHETAMINE-DEXTROAMPHETAMINE 20 MG PO TABS: 20.0000 mg | ORAL_TABLET | Freq: Every day | ORAL | 0 refills | Status: DC

## 2020-03-20 MED FILL — AMPHETAMINE-DEXTROAMPHETAMI: 20 | 60 days supply | Qty: 90 | Fill #0

## 2020-05-06 ENCOUNTER — Other Ambulatory Visit: Payer: Self-pay | Admitting: Osteopathic Medicine

## 2020-05-06 DIAGNOSIS — F339 Major depressive disorder, recurrent, unspecified: Secondary | ICD-10-CM

## 2020-05-06 DIAGNOSIS — F988 Other specified behavioral and emotional disorders with onset usually occurring in childhood and adolescence: Secondary | ICD-10-CM

## 2020-05-06 DIAGNOSIS — Z8659 Personal history of other mental and behavioral disorders: Secondary | ICD-10-CM

## 2020-05-06 NOTE — Telephone Encounter (Signed)
Last refill- 02/05/2020 Last ov- 11/06/2019

## 2020-05-06 NOTE — Telephone Encounter (Signed)
duplicat encounter disregard

## 2020-05-07 MED ORDER — LISDEXAMFETAMINE DIMESYLATE 60 MG PO CAPS: 60.0000 mg | ORAL_CAPSULE | ORAL | 0 refills | Status: DC

## 2020-05-07 MED FILL — VYVANSE 60 MG CAPSULE: 60 | 90 days supply | Qty: 90 | Fill #0

## 2020-05-08 ENCOUNTER — Telehealth: Payer: Self-pay | Admitting: Osteopathic Medicine

## 2020-05-08 ENCOUNTER — Ambulatory Visit: Payer: No Typology Code available for payment source | Admitting: Osteopathic Medicine

## 2020-05-08 NOTE — Telephone Encounter (Signed)
Ptt called at 9:23 to reschedule her appt/not feeling well.  Thanks

## 2020-05-13 ENCOUNTER — Encounter: Payer: Self-pay | Admitting: Osteopathic Medicine

## 2020-05-13 DIAGNOSIS — Z8659 Personal history of other mental and behavioral disorders: Secondary | ICD-10-CM

## 2020-05-13 DIAGNOSIS — F339 Major depressive disorder, recurrent, unspecified: Secondary | ICD-10-CM

## 2020-05-13 MED ORDER — BUPROPION HCL ER (XL) 150 MG PO TB24: 150.0000 mg | ORAL_TABLET | Freq: Every day | ORAL | 0 refills | Status: DC

## 2020-05-13 MED ORDER — LAMOTRIGINE 150 MG PO TABS: 150.0000 mg | ORAL_TABLET | Freq: Every day | ORAL | 0 refills | Status: DC

## 2020-05-13 MED FILL — buPROPion HCL ER (XL) 150 M: 150 | 30 days supply | Qty: 30 | Fill #0

## 2020-05-13 MED FILL — SUBVENITE 150 MG TABS: 150 | 30 days supply | Qty: 30 | Fill #0

## 2020-05-20 ENCOUNTER — Other Ambulatory Visit: Payer: Self-pay | Admitting: Osteopathic Medicine

## 2020-05-21 NOTE — Telephone Encounter (Signed)
Last refill- 03/19/20 Last ov- 11/06/2019 No show for 05/08/20

## 2020-05-22 ENCOUNTER — Other Ambulatory Visit: Payer: Self-pay | Admitting: Osteopathic Medicine

## 2020-05-22 MED ORDER — AMPHETAMINE-DEXTROAMPHETAMINE 20 MG PO TABS: 20.0000 mg | ORAL_TABLET | Freq: Every day | ORAL | 0 refills | Status: DC

## 2020-05-22 MED FILL — AMPHETAMINE-DEXTROAMPHETAMI: 20 | 60 days supply | Qty: 90 | Fill #0

## 2020-05-29 ENCOUNTER — Other Ambulatory Visit: Payer: Self-pay | Admitting: Osteopathic Medicine

## 2020-05-29 ENCOUNTER — Encounter: Payer: Self-pay | Admitting: Osteopathic Medicine

## 2020-05-29 ENCOUNTER — Telehealth (INDEPENDENT_AMBULATORY_CARE_PROVIDER_SITE_OTHER): Payer: No Typology Code available for payment source | Admitting: Osteopathic Medicine

## 2020-05-29 DIAGNOSIS — Z8659 Personal history of other mental and behavioral disorders: Secondary | ICD-10-CM | POA: Diagnosis not present

## 2020-05-29 DIAGNOSIS — F339 Major depressive disorder, recurrent, unspecified: Secondary | ICD-10-CM | POA: Diagnosis not present

## 2020-05-29 MED ORDER — BUPROPION HCL ER (XL) 150 MG PO TB24: 150.0000 mg | ORAL_TABLET | Freq: Every day | ORAL | 3 refills | Status: DC

## 2020-05-29 MED ORDER — LAMOTRIGINE 150 MG PO TABS: 150.0000 mg | ORAL_TABLET | Freq: Every day | ORAL | 3 refills | Status: DC

## 2020-05-29 NOTE — Progress Notes (Signed)
Virtual Visit via Video (App used: MyChart) Note  I connected with      Jenny Navarro on 05/29/20 at 7:51 AM  by a telemedicine application and verified that I am speaking with the correct person using two identifiers.  Patient is at home I am in office   I discussed the limitations of evaluation and management by telemedicine and the availability of in person appointments. The patient expressed understanding and agreed to proceed.  History of Present Illness: Jenny Navarro is a 28 y.o. female who would like to discuss ADHD Rx refill. Doing well on current medications         Observations/Objective: BP 126/73   Pulse 89   Temp (!) 97 F (36.1 C)   Wt 168 lb (76.2 kg)   BMI 27.12 kg/m   Wt Readings from Last 3 Encounters:  05/29/20 168 lb (76.2 kg)  11/06/19 189 lb (85.7 kg)  06/27/19 186 lb (84.4 kg)   BP Readings from Last 3 Encounters:  05/29/20 126/73  11/06/19 135/90  06/27/19 117/86   Exam: Normal Speech.  NAD  Lab and Radiology Results No results found for this or any previous visit (from the past 72 hour(s)). No results found.     Assessment and Plan: 29 y.o. female with Diagnoses of History of bipolar disorder and Depression, recurrent (HCC) were pertinent to this visit.  Doing well on Rx Continue current regimen  No other concerns today  Refills sent  PDMP reviewed during this encounter. No orders of the defined types were placed in this encounter.  Meds ordered this encounter  Medications  . lamoTRIgine (LAMICTAL) 150 MG tablet    Sig: Take 1 tablet (150 mg total) by mouth daily.    Dispense:  90 tablet    Refill:  3  . buPROPion (WELLBUTRIN XL) 150 MG 24 hr tablet    Sig: Take 1 tablet (150 mg total) by mouth daily.    Dispense:  90 tablet    Refill:  3   There are no Patient Instructions on file for this visit.  Instructions sent via MyChart. If MyChart not available, pt was given option for info via personal e-mail w/ no guarantee  of protected health info over unsecured e-mail communication, and MyChart sign-up instructions were sent to patient.   Follow Up Instructions: Return in about 6 months (around 11/27/2020) for ANNUAL to include Pap (call week prior to visit for lab orders).    I discussed the assessment and treatment plan with the patient. The patient was provided an opportunity to ask questions and all were answered. The patient agreed with the plan and demonstrated an understanding of the instructions.   The patient was advised to call back or seek an in-person evaluation if any new concerns, if symptoms worsen or if the condition fails to improve as anticipated.  20 minutes of non-face-to-face time was provided during this encounter.      . . . . . . . . . . . . . Marland Kitchen                   Historical information moved to improve visibility of documentation.  Past Medical History:  Diagnosis Date  . Adult attention deficit disorder 06/02/2016   Awaiting records as of 06/02/2016  . History of bipolar disorder 09/08/2016  . Tachycardia 07/27/2017   No past surgical history on file. Social History   Tobacco Use  . Smoking status: Never Smoker  .  Smokeless tobacco: Never Used  Substance Use Topics  . Alcohol use: Not on file   family history is not on file.  Medications: Current Outpatient Medications  Medication Sig Dispense Refill  . amphetamine-dextroamphetamine (ADDERALL) 20 MG tablet Take 1-1.5 tablets (20-30 mg total) by mouth daily. In afternoons as needed for attention deficit 90 tablet 0  . buPROPion (WELLBUTRIN XL) 150 MG 24 hr tablet Take 1 tablet (150 mg total) by mouth daily. 90 tablet 3  . lamoTRIgine (LAMICTAL) 150 MG tablet Take 1 tablet (150 mg total) by mouth daily. 90 tablet 3  . lisdexamfetamine (VYVANSE) 60 MG capsule Take 1 capsule (60 mg total) by mouth every morning. Fill when due 90 capsule 0  . Levonorgestrel ([name]) 19.5 MG IUD 1 each (19.5 mg  total) by Intrauterine route once for 1 dose. 1 each 0   No current facility-administered medications for this visit.   Allergies  Allergen Reactions  . Penicillins

## 2020-06-06 ENCOUNTER — Encounter: Payer: No Typology Code available for payment source | Admitting: Osteopathic Medicine

## 2020-06-13 MED FILL — SUBVENITE 150 MG TABS: 150 | 90 days supply | Qty: 90 | Fill #0

## 2020-06-13 MED FILL — buPROPion HCL ER (XL) 150 M: 150 | 90 days supply | Qty: 90 | Fill #0

## 2020-07-22 ENCOUNTER — Other Ambulatory Visit: Payer: Self-pay | Admitting: Osteopathic Medicine

## 2020-07-22 MED ORDER — AMPHETAMINE-DEXTROAMPHETAMINE 20 MG PO TABS: 20.0000 mg | ORAL_TABLET | Freq: Every day | ORAL | 0 refills | Status: DC

## 2020-07-22 MED FILL — AMPHETAMINE-DEXTROAMPHETAMI: 20 | 60 days supply | Qty: 90 | Fill #0

## 2020-08-05 ENCOUNTER — Other Ambulatory Visit: Payer: Self-pay | Admitting: Osteopathic Medicine

## 2020-08-05 DIAGNOSIS — F988 Other specified behavioral and emotional disorders with onset usually occurring in childhood and adolescence: Secondary | ICD-10-CM

## 2020-08-06 ENCOUNTER — Other Ambulatory Visit: Payer: Self-pay | Admitting: Osteopathic Medicine

## 2020-08-06 MED ORDER — LISDEXAMFETAMINE DIMESYLATE 60 MG PO CAPS: 60.0000 mg | ORAL_CAPSULE | ORAL | 0 refills | Status: DC

## 2020-08-06 MED FILL — VYVANSE 60 MG CAPSULE: 60 | 90 days supply | Qty: 90 | Fill #0

## 2020-09-17 MED FILL — buPROPion HCL ER (XL) 150 M: 150 | 90 days supply | Qty: 90 | Fill #1

## 2020-09-17 MED FILL — SUBVENITE 150 MG TABS: 150 | 90 days supply | Qty: 90 | Fill #1

## 2020-09-23 ENCOUNTER — Other Ambulatory Visit: Payer: Self-pay | Admitting: Osteopathic Medicine

## 2020-09-24 ENCOUNTER — Other Ambulatory Visit: Payer: Self-pay | Admitting: Osteopathic Medicine

## 2020-09-24 NOTE — Telephone Encounter (Signed)
Please call patient: Received refill request for Adderall, please confirm if she is taking 1 tablet (20 MG) or 1.5 (30 MG) tablets in the afternoons daily?

## 2020-09-25 NOTE — Telephone Encounter (Signed)
Left a detailed vm msg for pt to confirm daily dose of adderall. Direct call back info provided.

## 2020-09-25 NOTE — Telephone Encounter (Signed)
Per patient, taking 1.5 (30 mg) daily in the afternoon.

## 2020-09-26 NOTE — Telephone Encounter (Signed)
Pt called requesting if provider has made the adjustments to her adderall rx? Pt has been out since Monday.

## 2020-09-27 ENCOUNTER — Other Ambulatory Visit: Payer: Self-pay | Admitting: Osteopathic Medicine

## 2020-09-27 MED FILL — AMPHETAMINE-DEXTROAMPHETAMI: 30 | 90 days supply | Qty: 90 | Fill #0

## 2020-09-27 NOTE — Telephone Encounter (Signed)
I sent 30 mg pills so she doen't have to keep taking 1.5 tabs she can just take 1

## 2020-09-27 NOTE — Telephone Encounter (Signed)
Task completed. Pt has been updated about change in adderall rx.

## 2020-11-04 ENCOUNTER — Other Ambulatory Visit: Payer: Self-pay | Admitting: Osteopathic Medicine

## 2020-11-04 DIAGNOSIS — F988 Other specified behavioral and emotional disorders with onset usually occurring in childhood and adolescence: Secondary | ICD-10-CM

## 2020-11-06 ENCOUNTER — Other Ambulatory Visit: Payer: Self-pay | Admitting: Osteopathic Medicine

## 2020-11-06 MED ORDER — LISDEXAMFETAMINE DIMESYLATE 60 MG PO CAPS: 60.0000 mg | ORAL_CAPSULE | ORAL | 0 refills | Status: DC

## 2020-11-06 MED FILL — VYVANSE 60 MG CAPSULE: 60 | 90 days supply | Qty: 90 | Fill #0

## 2020-11-25 ENCOUNTER — Encounter: Payer: No Typology Code available for payment source | Admitting: Osteopathic Medicine

## 2020-12-17 ENCOUNTER — Ambulatory Visit (INDEPENDENT_AMBULATORY_CARE_PROVIDER_SITE_OTHER): Payer: No Typology Code available for payment source | Admitting: Osteopathic Medicine

## 2020-12-17 ENCOUNTER — Other Ambulatory Visit: Payer: Self-pay

## 2020-12-17 ENCOUNTER — Encounter: Payer: Self-pay | Admitting: Osteopathic Medicine

## 2020-12-17 ENCOUNTER — Other Ambulatory Visit (HOSPITAL_COMMUNITY): Payer: Self-pay

## 2020-12-17 ENCOUNTER — Other Ambulatory Visit (HOSPITAL_COMMUNITY)
Admission: RE | Admit: 2020-12-17 | Discharge: 2020-12-17 | Disposition: A | Discharge status: Home / Self Care | Payer: No Typology Code available for payment source | Source: Ambulatory Visit | Attending: Osteopathic Medicine | Admitting: Osteopathic Medicine

## 2020-12-17 VITALS — BP 125/90 | HR 91 | Temp 98.9°F | Wt 179.1 lb

## 2020-12-17 DIAGNOSIS — R87619 Unspecified abnormal cytological findings in specimens from cervix uteri: Secondary | ICD-10-CM

## 2020-12-17 DIAGNOSIS — Z124 Encounter for screening for malignant neoplasm of cervix: Secondary | ICD-10-CM | POA: Insufficient documentation

## 2020-12-17 DIAGNOSIS — Z0000 Encounter for general adult medical examination without abnormal findings: Secondary | ICD-10-CM | POA: Insufficient documentation

## 2020-12-17 DIAGNOSIS — F988 Other specified behavioral and emotional disorders with onset usually occurring in childhood and adolescence: Secondary | ICD-10-CM | POA: Diagnosis not present

## 2020-12-17 MED ORDER — LISDEXAMFETAMINE DIMESYLATE 20 MG PO CAPS
ORAL_CAPSULE | ORAL | 0 refills | Status: AC
  Filled 2020-12-17 (×2): qty 60, 30d supply, fill #0

## 2020-12-17 MED ORDER — NORGESTIMATE-ETH ESTRADIOL 0.25-35 MG-MCG PO TABS
1.0000 | ORAL_TABLET | Freq: Every day | ORAL | 3 refills | Status: AC
  Filled 2020-12-17: qty 84, 84d supply, fill #0

## 2020-12-17 MED ORDER — AMPHETAMINE-DEXTROAMPHETAMINE 20 MG PO TABS
ORAL_TABLET | ORAL | 0 refills | Status: DC
  Filled 2020-12-17: qty 45, 30d supply, fill #0
  Filled 2020-12-17 (×2): qty 45, 60d supply, fill #0
  Filled ????-??-??: fill #0

## 2020-12-17 MED FILL — Lamotrigine Tab 150 MG: ORAL | 90 days supply | Qty: 90 | Fill #0 | Status: AC

## 2020-12-17 MED FILL — Bupropion HCl Tab ER 24HR 150 MG: ORAL | 90 days supply | Qty: 90 | Fill #0 | Status: AC

## 2020-12-17 NOTE — Progress Notes (Signed)
Jenny Navarro is a 30 y.o. female who presents to  [facility] at [facility]  today, 12/17/20, seeking care for the following:  . Annual physical  . Pap, would like IUD out and back on OCP . Will be getting married next month and moving to [facility]! Would like to taper off ADHD Rx in anticipation of trying for pregnancy soon after getting married.     ASSESSMENT & PLAN with other pertinent findings:  The primary encounter diagnosis was Annual physical exam. Diagnoses of Cervical cancer screening and Adult attention deficit disorder were also pertinent to this visit.     Patient Instructions  General Preventive Care  Most recent routine screening labs: ordered.   Blood pressure goal 130/80 or less.   Tobacco: don't!   Alcohol: responsible moderation is ok for most adults - if you have concerns about your alcohol intake, please talk to me!   Exercise: as tolerated to reduce risk of cardiovascular disease and diabetes. Strength training will also prevent osteoporosis.   Mental health: if need for mental health care (medicines, counseling, other), or concerns about moods, please let me know!   Sexual / Reproductive health: if need for STD testing, or if concerns with libido/pain problems, please let me know! If you need to discuss family planning, please let me know!   Advanced Directive: Living Will and/or Healthcare Power of Attorney recommended for all adults, regardless of age or health.  Vaccines  Flu vaccine: for almost everyone, every fall.   Shingles vaccine: after age 92.   Pneumonia vaccines: after age 96  Tetanus booster: every 10 years / 3rd trimester of pregnancy  HPV vaccine: Gardasil up to age 76 to prevent HPV-associated diseases, including certain cancers.   COVID vaccine: THANKS for getting your vaccine! :)  Cancer screenings   Colon cancer screening: for everyone age 7-75.   Breast cancer screening: mammogram at  age 28   Cervical cancer screening: Pap every 5 years if normal  Lung cancer screening: not needed for non-smokers  Infection screenings  . HIV: recommended screening at least once age 10-65, more often as needed. Nanetta Batty, other STI: screening as needed . Hepatitis C: recommended once for everyone age 24-75 . TB: certain at-risk populations, or depending on work requirements and/or travel history   No orders of the defined types were placed in this encounter.   Meds ordered this encounter  Medications  . norgestimate-ethinyl estradiol (ORTHO-CYCLEN, 28,) 0.25-35 MG-MCG tablet    Sig: Take 1 tablet by mouth daily.    Dispense:  84 tablet    Refill:  3  . lisdexamfetamine (VYVANSE) 20 MG capsule    Sig: Take 2 capsules (40 mg total) by mouth every morning for 30 days, THEN 1 capsule (20 mg total) every morning.    Dispense:  90 capsule    Refill:  0  . amphetamine-dextroamphetamine (ADDERALL) 20 MG tablet    Sig: Take 1 tablet (20 mg total) by mouth daily for 30 days, THEN 0.5 tablets (10 mg total) daily.    Dispense:  45 tablet    Refill:  0    Immunization History  Administered Date(s) Administered  . Influenza,inj,Quad PF,6+ Mos 04/13/2018, 04/17/2019  . Influenza-Unspecified 06/17/2016, 04/17/2019  . PFIZER(Purple Top)SARS-COV-2 Vaccination 08/16/2019, 09/07/2019  . Tdap 06/17/2016    See below for relevant physical exam findings  See below for recent lab and imaging results reviewed  Medications, allergies, PMH, PSH, SocH, FamH reviewed  below    Follow-up instructions: as-needed prior to establishing w/ new PCP/OBGYN in [place]! OK to MyChart message me!                                         Exam:  BP 125/90 (BP Location: Left Arm, Patient Position: Sitting, Cuff Size: Large)   Pulse 91   Temp 98.9 F (37.2 C) (Oral)   Wt 179 lb 1.9 oz (81.2 kg)   BMI 28.91 kg/m   Constitutional: VS see above. General  Appearance: alert, well-developed, well-nourished, NAD  Neck: No masses, trachea midline.   Respiratory: Normal respiratory effort.   Musculoskeletal: Gait normal.   Abdominal: non-tender, non-distended  Neurological: Normal balance/coordination. No tremor.  Skin: warm, dry, intact.  Psychiatric: Normal judgment/insight. Normal mood and affect. Oriented x3.  GYN: No lesions/ulcers to external genitalia, normal urethra, normal vaginal mucosa, physiologic discharge, cervix normal without lesions, uterus not enlarged or tender, adnexa no masses and nontender  Current Meds  Medication Sig  . amphetamine-dextroamphetamine (ADDERALL) 20 MG tablet Take 1 tablet (20 mg total) by mouth daily for 30 days, THEN 0.5 tablets (10 mg total) daily.  Marland Kitchen buPROPion (WELLBUTRIN XL) 150 MG 24 hr tablet TAKE 1 TABLET BY MOUTH DAILY  . lamoTRIgine (LAMICTAL) 150 MG tablet TAKE 1 TABLET BY MOUTH DAILY  . norgestimate-ethinyl estradiol (ORTHO-CYCLEN, 28,) 0.25-35 MG-MCG tablet Take 1 tablet by mouth daily.  . [DISCONTINUED] amphetamine-dextroamphetamine (ADDERALL) 30 MG tablet TAKE 1 TABLET BY MOUTH ONCE A DAY  . [DISCONTINUED] lisdexamfetamine (VYVANSE) 60 MG capsule TAKE 1 CAPSULE BY MOUTH EVERY MORNING    Allergies  Allergen Reactions  . Penicillins     Patient Active Problem List   Diagnosis Date Noted  . Abnormal weight gain 12/22/2018  . Class 1 obesity without serious comorbidity with body mass index (BMI) of 30.0 to 30.9 in adult 12/22/2018  . Recurrent major depressive disorder, in full remission (HCC) 07/20/2018  . Tachycardia 07/27/2017  . Encounter for surveillance of injectable contraceptive 05/28/2017  . History of bipolar disorder 09/08/2016  . Breast lump in female 06/02/2016  . Adult attention deficit disorder 06/02/2016    No family history on file.  Social History   Tobacco Use  Smoking Status Never Smoker  Smokeless Tobacco Never Used    No past surgical history on  file.  Immunization History  Administered Date(s) Administered  . Influenza,inj,Quad PF,6+ Mos 04/13/2018, 04/17/2019  . Influenza-Unspecified 06/17/2016, 04/17/2019  . PFIZER(Purple Top)SARS-COV-2 Vaccination 08/16/2019, 09/07/2019  . Tdap 06/17/2016    No results found for this or any previous visit (from the past 2160 hour(s)).  No results found.     All questions at time of visit were answered - patient instructed to contact office with any additional concerns or updates. ER/RTC precautions were reviewed with the patient as applicable.   Please note: manual typing as well as voice recognition software may have been used to produce this document - typos may escape review. Please contact Dr. Lyn Hollingshead for any needed clarifications.

## 2020-12-17 NOTE — Patient Instructions (Signed)
General Preventive Care  Most recent routine screening labs: ordered.   Blood pressure goal 130/80 or less.   Tobacco: don't!   Alcohol: responsible moderation is ok for most adults - if you have concerns about your alcohol intake, please talk to me!   Exercise: as tolerated to reduce risk of cardiovascular disease and diabetes. Strength training will also prevent osteoporosis.   Mental health: if need for mental health care (medicines, counseling, other), or concerns about moods, please let me know!   Sexual / Reproductive health: if need for STD testing, or if concerns with libido/pain problems, please let me know! If you need to discuss family planning, please let me know!   Advanced Directive: Living Will and/or Healthcare Power of Attorney recommended for all adults, regardless of age or health.  Vaccines  Flu vaccine: for almost everyone, every fall.   Shingles vaccine: after age 79.   Pneumonia vaccines: after age 7  Tetanus booster: every 10 years / 3rd trimester of pregnancy  HPV vaccine: Gardasil up to age 52 to prevent HPV-associated diseases, including certain cancers.   COVID vaccine: THANKS for getting your vaccine! :)  Cancer screenings   Colon cancer screening: for everyone age 46-75.   Breast cancer screening: mammogram at age 85   Cervical cancer screening: Pap every 5 years if normal  Lung cancer screening: not needed for non-smokers  Infection screenings  . HIV: recommended screening at least once age 67-65, more often as needed. Nanetta Batty, other STI: screening as needed . Hepatitis C: recommended once for everyone age 14-75 . TB: certain at-risk populations, or depending on work requirements and/or travel history

## 2020-12-19 DIAGNOSIS — R87619 Unspecified abnormal cytological findings in specimens from cervix uteri: Secondary | ICD-10-CM | POA: Insufficient documentation

## 2020-12-19 LAB — CYTOLOGY - PAP
Comment: NEGATIVE
High risk HPV: NEGATIVE

## 2020-12-19 NOTE — Addendum Note (Signed)
Addended by: Deirdre Pippins on: 12/19/2020 08:56 PM   Modules accepted: Orders

## 2020-12-20 ENCOUNTER — Encounter: Payer: Self-pay | Admitting: Osteopathic Medicine

## 2020-12-23 ENCOUNTER — Other Ambulatory Visit (HOSPITAL_COMMUNITY)
Admission: RE | Admit: 2020-12-23 | Discharge: 2020-12-23 | Disposition: A | Discharge status: Home / Self Care | Payer: No Typology Code available for payment source | Source: Ambulatory Visit | Attending: Obstetrics & Gynecology | Admitting: Obstetrics & Gynecology

## 2020-12-23 ENCOUNTER — Ambulatory Visit: Payer: No Typology Code available for payment source | Admitting: Obstetrics & Gynecology

## 2020-12-23 ENCOUNTER — Encounter: Payer: Self-pay | Admitting: Obstetrics & Gynecology

## 2020-12-23 ENCOUNTER — Other Ambulatory Visit: Payer: Self-pay

## 2020-12-23 VITALS — BP 131/82 | HR 109 | Resp 16 | Ht 66.0 in | Wt 174.0 lb

## 2020-12-23 DIAGNOSIS — R87619 Unspecified abnormal cytological findings in specimens from cervix uteri: Secondary | ICD-10-CM | POA: Insufficient documentation

## 2020-12-23 DIAGNOSIS — Z01818 Encounter for other preprocedural examination: Secondary | ICD-10-CM

## 2020-12-23 DIAGNOSIS — R87618 Other abnormal cytological findings on specimens from cervix uteri: Secondary | ICD-10-CM | POA: Diagnosis not present

## 2020-12-23 DIAGNOSIS — Z920 Personal history of contraception: Secondary | ICD-10-CM

## 2020-12-23 LAB — POCT URINE PREGNANCY: Preg Test, Ur: NEGATIVE

## 2020-12-23 NOTE — Progress Notes (Signed)
Colposcopy Procedure Note  Indications: Pap smear 1 months ago showed: AGS. The prior pap showed no abnormalities.  Prior cervical/vaginal disease: Prior cervical treatment: no treatment.  Procedure Details  The risks and benefits of the procedure and Written informed consent obtained.  Speculum placed in vagina and excellent visualization of cervix achieved, cervix swabbed x 3 with acetic acid solution.  Findings: Cervix: faint acetowhite lesion(s) noted at 8 & 10 o'clock; cervix swabbed with Lugol's solution, SCJ visualized - lesion at 8 & 10 o'clock, cervical biopsies taken at 8 & 10 o'clock, specimen labelled and sent to pathology and hemostasis achieved with Monsel's solution. Vaginal inspection: vaginal colposcopy not performed. Vulvar colposcopy: vulvar colposcopy not performed.  Specimens: cervical Biopsies at 8 & 10 and ECC  Complications: none.  Plan: Specimens labelled and sent to Pathology. Will base further treatment on Pathology findings. Post biopsy instructions given to patient.

## 2020-12-23 NOTE — Progress Notes (Signed)
   Subjective:    Patient ID: Jenny Navarro, female    DOB: 1990-12-03, 30 y.o.   MRN: [id number]  HPI  30 year old female presents for first visit.  Patient was recently seen by her primary care provider, Dr. Lyn Hollingshead, and had a Pap smear done.  She also had her [name] IUD recently removed.  Patient has been having some spotting post IUD removal which is normal.  Patient does not have long periods of anovulation without an IUD in situ.  Review of Systems  Constitutional: Negative.   Respiratory: Negative.   Cardiovascular: Negative.   Gastrointestinal: Negative.   Genitourinary: Negative.        Objective:   Physical Exam Vitals reviewed.  Constitutional:      General: She is not in acute distress.    Appearance: She is well-developed.  HENT:     Head: Normocephalic and atraumatic.  Eyes:     Conjunctiva/sclera: Conjunctivae normal.  Cardiovascular:     Rate and Rhythm: Normal rate.  Pulmonary:     Effort: Pulmonary effort is normal.  Genitourinary:    Comments: See colposcopy note for further exam details Skin:    General: Skin is warm and dry.  Neurological:     Mental Status: She is alert and oriented to person, place, and time.    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  (AGC NOS) 1.  Colpo as below 2.  Pt not at risk for endometrial carcinoma and endometrial biopsy is not indicated today.

## 2020-12-25 LAB — SURGICAL PATHOLOGY

## 2021-01-12 ENCOUNTER — Encounter: Payer: Self-pay | Admitting: Osteopathic Medicine

## 2021-01-17 MED ORDER — AMPHETAMINE-DEXTROAMPHETAMINE 20 MG PO TABS: 10.0000 mg | ORAL_TABLET | Freq: Two times a day (BID) | ORAL | 0 refills | Status: DC

## 2021-02-20 ENCOUNTER — Other Ambulatory Visit: Payer: Self-pay | Admitting: Osteopathic Medicine

## 2021-02-21 MED ORDER — AMPHETAMINE-DEXTROAMPHETAMINE 20 MG PO TABS: 10.0000 mg | ORAL_TABLET | Freq: Two times a day (BID) | ORAL | 0 refills | Status: DC

## 2021-03-24 ENCOUNTER — Encounter: Payer: Self-pay | Admitting: Osteopathic Medicine

## 2021-03-24 ENCOUNTER — Other Ambulatory Visit: Payer: Self-pay | Admitting: Osteopathic Medicine

## 2021-03-24 DIAGNOSIS — F339 Major depressive disorder, recurrent, unspecified: Secondary | ICD-10-CM

## 2021-03-24 DIAGNOSIS — Z8659 Personal history of other mental and behavioral disorders: Secondary | ICD-10-CM

## 2021-03-24 MED ORDER — BUPROPION HCL ER (XL) 150 MG PO TB24: ORAL_TABLET | Freq: Every day | ORAL | 0 refills | Status: AC

## 2021-03-24 MED ORDER — AMPHETAMINE-DEXTROAMPHETAMINE 20 MG PO TABS: 10.0000 mg | ORAL_TABLET | Freq: Two times a day (BID) | ORAL | 0 refills | Status: DC

## 2021-03-24 MED ORDER — LAMOTRIGINE 150 MG PO TABS: ORAL_TABLET | Freq: Every day | ORAL | 0 refills | Status: AC

## 2021-04-22 ENCOUNTER — Other Ambulatory Visit: Payer: Self-pay | Admitting: Osteopathic Medicine

## 2021-04-24 ENCOUNTER — Encounter: Payer: Self-pay | Admitting: Osteopathic Medicine

## 2021-04-24 MED ORDER — AMPHETAMINE-DEXTROAMPHETAMINE 20 MG PO TABS: 10.0000 mg | ORAL_TABLET | Freq: Two times a day (BID) | ORAL | 0 refills | Status: AC
